# Patient Record
Sex: Female | Born: 1975 | Race: Black or African American | Hispanic: No | Marital: Single
Health system: Midwestern US, Community
[De-identification: ages and names within clinical notes are randomized; demographics above are authoritative.]

## PROBLEM LIST (undated history)

## (undated) DIAGNOSIS — E1165 Type 2 diabetes mellitus with hyperglycemia: Secondary | ICD-10-CM

## (undated) DIAGNOSIS — Z794 Long term (current) use of insulin: Secondary | ICD-10-CM

## (undated) DIAGNOSIS — F411 Generalized anxiety disorder: Secondary | ICD-10-CM

## (undated) DIAGNOSIS — E042 Nontoxic multinodular goiter: Secondary | ICD-10-CM

## (undated) DIAGNOSIS — I10 Essential (primary) hypertension: Principal | ICD-10-CM

## (undated) DIAGNOSIS — E559 Vitamin D deficiency, unspecified: Secondary | ICD-10-CM

## (undated) DIAGNOSIS — E119 Type 2 diabetes mellitus without complications: Principal | ICD-10-CM

## (undated) DIAGNOSIS — Z1231 Encounter for screening mammogram for malignant neoplasm of breast: Secondary | ICD-10-CM

## (undated) DIAGNOSIS — R51 Headache: Secondary | ICD-10-CM

## (undated) DIAGNOSIS — R519 Headache, unspecified: Secondary | ICD-10-CM

## (undated) DIAGNOSIS — E782 Mixed hyperlipidemia: Secondary | ICD-10-CM

## (undated) DIAGNOSIS — G4750 Parasomnia, unspecified: Secondary | ICD-10-CM

## (undated) HISTORY — PX: FOOT SURGERY: SHX648

## (undated) HISTORY — PX: TUBAL LIGATION: SHX77

## (undated) HISTORY — PX: DENTAL SURGERY: SHX609

---

## 2012-10-05 MED ORDER — RIZATRIPTAN 10 MG TAB, RAPID DISSOLVE
10 mg | ORAL_TABLET | ORAL | Status: DC
Start: 2012-10-05 — End: 2019-10-17

## 2012-10-05 NOTE — Progress Notes (Signed)
[facility] NEUROLOGY [place], [facility] [place] [facility]  [address]  [place], [place] [address]  [phone] Fax                  Referring: No primary provider on file.    Chief Complaint   Patient presents with   ??? Headache   Here for evaluation of headaches.  She has had headaches all her life.  She says she will get a HA every day and she will awaken with HA qam and takes excedrin.  She takes this qday.  She snores.  She will awaken snoring.  She will hear herself snoring in her sleep.  She says starting in March she started to have a "constant migraine" and has had to get toradol shots.  It came back a month ago and had another Toradol shot.  She will have a more intense HA around the time of her period.  She notes this will last for days.  She is irritable.  She denies nausea or vomit.  She just wants to go home.  Photophobia and phonophobia present.  Duration typically 4-6 days no matter what she takes although she will get some relief from excedrin.  She has been given fioricet and no help.  Imitrex tabs tried, unknown dose and says it was ineffective.  Also tried the imitrex nasal spray.  No other triptans.  She has been started on topamax for 1 month and no difference.  No polysomnogram.  Not gone 6-8 weeks without excedrin.  No focal defits, visual complaint.    Past Medical History   Diagnosis Date   ??? Headache    ??? Diabetes    ??? Hypertension    ??? Depression      Past Surgical History   Procedure Laterality Date   ??? Hx gyn         Current Outpatient Prescriptions   Medication Sig Dispense Refill   ??? topiramate (TOPAMAX) 50 mg tablet Take  by mouth daily.       ??? ketorolac (TORADOL) 10 mg tablet Take  by mouth. q6h as need up to 5 days.       ??? amLODIPine (NORVASC) 10 mg tablet Take  by mouth daily.       ??? metFORMIN (GLUCOPHAGE) 500 mg tablet Take  by mouth two (2) times daily (with meals).       ??? Liraglutide (VICTOZA) 0.6 mg/0.1 mL (18 mg/3 mL) sub-q pen 0.6 mg by  SubCUTAneous route daily.         No current facility-administered medications for this visit.    BG controlled    Allergies   Allergen Reactions   ??? Codeine Other (comments)     dizziness   ??? Percocet (Oxycodone-Acetaminophen) Other (comments)     dizziness       History   Substance Use Topics   ??? Smoking status: Never Smoker    ??? Smokeless tobacco: Never Used   ??? Alcohol Use: No   Works in [employer]    History reviewed. No pertinent family history.    Review of Systems  As above. See intake form in CC media section  Remainder of comprehensive review is negative    Examination  BP 138/96   Pulse 98   Temp(Src) 98.3 ??F (36.8 ??C)   Ht 5\' 8"  (1.727 m)   Wt 130.636 kg (288 lb)   BMI 43.8 kg/m2   SpO2 99%  Pleasant, well appearing.  No icterus.  Oropharynx clear.  Supple neck without bruit.  Heart regular.  No murmur.  No edema.      Neurologically, she is awake, alert, and oriented with normal speech and language.  Her cognition is normal.    Intact cranial nerves 2-12.  No nystagmus.  Visual fields full to confrontation.  Disk margins are flat bilaterally.  She has normal bulk and tone.  She has no abnormal movement.  She has no pronation or drift.  She generates full strength in the upper and lower extremities to direct confrontational testing.  Reflexes are symmetrical in the upper and lower extremities bilaterally.  Her toes are down bilaterally.  No Hoffman.  Finger nose finger and rapid alternating movements are normal.  Steady gait.   No sensory deficit to primary modalities.      Impression/Plan  Migraines.  We discussed migraine pathophysiology and the medications we use to treat them including triptans for abortive therapy and medications use to prevent.  We discussed migraines are to be viewed as a chronic condition which can be managed much like diabetes or HTN.    We discussed analgesic induced HA and the use of analgesics more than 2-3 times weekly will do nothing but drive the HA and to break this cycle  all analgesics, whether OTC or prescribed have to be discontinued and HA likely to get worse before they get better.    Written instructions provided today.    Given weight, sxs suggestive of OSA and HA on awakening will get polysomnogram    Follow after above

## 2012-10-05 NOTE — Patient Instructions (Addendum)
Stop all excedrin and headaches will get worse before they get better    Use Maxalt to abort a severe HA around your period headache.  1 at HA onset and repeat in 2 hours if needed.  Max 2 in 24 hours    Have sleep study done

## 2012-10-05 NOTE — Progress Notes (Signed)
New patient presenting with headaches. History of migraines for 12 years. Has tried topamax in the past. Reports daily headaches and migraines monthly during cycles.

## 2012-10-09 NOTE — Telephone Encounter (Signed)
Called and LM on VM for patient that we did submit the PA last Friday 10/06/12 for the Maxalt. Informed her that there was a 48-72 business hour turnaround time. Notified her that once a decision was received that we would notify her and her pharmacy. Left our office phone number if she had any further questions.

## 2012-10-09 NOTE — Telephone Encounter (Signed)
Pt calling to check status of the PA for the maxalt.  She said you may call and if you get vm just leave a msg of what the status is.

## 2012-10-12 NOTE — Telephone Encounter (Signed)
Patient called checking to see the status of her PA for her Maxalt.

## 2012-10-16 NOTE — Telephone Encounter (Addendum)
Called and spoke with [facility]. Spoke with Corsica P. The generic Maxalt was approved, but the quantity still needed to be. Quantity was approved from 10/16/12 to 10/16/13. Medication approved from 10/06/12 to 10/07/13. Ref # [id number].     Called pharmacy and notified them of the approval. Called [facility] pharmacy [phone]    Called and notified the patient of this. She stated that she lived far from our office and wanted the medication called in somewhere close to her. Informed her I could give her the pharmacy number and have the RX transferred as the PA was attached to this transaction. She asked for me to call her back and leave info on her VM since she did not have a pen. Called back and left pharmacy phone number. Told her she should just be able to call her pharmacy and give them that number and they could take care of that from there.

## 2012-10-31 NOTE — Telephone Encounter (Signed)
Spoke to patient who stated Maxalt is not working. Continue to have migraines at cycle time. Informed patient to continue with plan at this time. Per Leonette Monarch patient she refer to headache manual patient was given at visit. Patient has manual. Per Diannia Ruder next month patient can use ibuprofen only at time of menstrual cycle and patient should start magnesium oxide per manual. Patient understood instructions.

## 2012-10-31 NOTE — Telephone Encounter (Addendum)
Via hello message : please call me reguarding the appt i had about a month ago. The medicine you gave me is not working.

## 2012-11-10 NOTE — Telephone Encounter (Signed)
Spoke to patient and informed her I have refaxed information to [facility] today. If she does not hear anything by Monday, call office back. Patient acknowledged understanding and appointment for 11/16/12 was cancelled and patient to reschedule after sleep study.

## 2012-11-10 NOTE — Telephone Encounter (Signed)
Patient says she has not been able to get in contact with [facility] for her sleep study. Please call.

## 2012-11-13 ENCOUNTER — Telehealth

## 2012-11-13 NOTE — Telephone Encounter (Signed)
STUDY ORDER CONFIRMATION    Study Indication:   327.20  Study: Diagnostic polysomnogram - CPT 95810: Split Study if patient meets the criteria.  Study Instructions / additional orders:    Sten Dematteo, M.D.  Diplomate in Sleep Medicine, [facility]

## 2012-11-13 NOTE — Telephone Encounter (Signed)
STUDY ORDER CONFIRMATION  Sandy King 11-06-1976      Type of Study Requested: Diagnostic polysomnogram - CPT 95810: Split Study if patient meets the criteria.  Referring Physician: Stanford Scotland      Date of Study: 11/15/12   Spoke with: Reuel Derby               Snoring      yes    Excessive Daytime Sleepiness   no    Witnessed Apnea     yes    Insomnia      no    Hypertension      yes    Cardiovascular Disease    no    Seizures      no    Height: 5'8"   Weight: 285   (over 499 lb can only be done at [facility])     Have you been in hospital?    no   Has it been at least 72 hrs. since discharge? no   (If not at least 72 hrs, cannot see pt. for study)    Are you currently on an antibiotic?   no  If yes, for what reason? n/a     Do you need anything from [place] for your employer?  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    no    Have you had a sleep study before?   no  If yes, when and where? n/a  Are you currently using CPAP?   no    Do you have any special needs?  Wheelchair      no  Oxygen      no    If on O2: n/al/m  For n/a Hrs  Help to and from the bathroom   no    (If yes to any special needs a care giver may need to come with the patient and stay the night.)    Notes: No sleep tak/walking  No grinding of teeth            Compiled by:  Jeral Pinch     Date:  11/13/12

## 2016-11-24 ENCOUNTER — Emergency Department (HOSPITAL_BASED_OUTPATIENT_CLINIC_OR_DEPARTMENT_OTHER)
Admission: EM | Admit: 2016-11-24 | Discharge: 2016-11-24 | Disposition: A | Discharge status: Home / Self Care | Payer: Medicaid Other | Attending: Physician Assistant | Admitting: Physician Assistant

## 2016-11-24 ENCOUNTER — Encounter (HOSPITAL_BASED_OUTPATIENT_CLINIC_OR_DEPARTMENT_OTHER): Payer: Self-pay | Admitting: *Deleted

## 2016-11-24 DIAGNOSIS — I10 Essential (primary) hypertension: Secondary | ICD-10-CM | POA: Insufficient documentation

## 2016-11-24 DIAGNOSIS — E119 Type 2 diabetes mellitus without complications: Secondary | ICD-10-CM | POA: Diagnosis not present

## 2016-11-24 DIAGNOSIS — G43109 Migraine with aura, not intractable, without status migrainosus: Secondary | ICD-10-CM | POA: Diagnosis not present

## 2016-11-24 DIAGNOSIS — Z79899 Other long term (current) drug therapy: Secondary | ICD-10-CM | POA: Diagnosis not present

## 2016-11-24 DIAGNOSIS — G43909 Migraine, unspecified, not intractable, without status migrainosus: Secondary | ICD-10-CM | POA: Diagnosis present

## 2016-11-24 HISTORY — DX: Type 2 diabetes mellitus without complications: E11.9

## 2016-11-24 HISTORY — DX: Essential (primary) hypertension: I10

## 2016-11-24 MED ORDER — KETOROLAC TROMETHAMINE 15 MG/ML IJ SOLN
INTRAMUSCULAR | Status: AC
  Filled 2016-11-24: qty 1

## 2016-11-24 MED ORDER — KETOROLAC TROMETHAMINE 30 MG/ML IJ SOLN
15.0000 mg | Freq: Once | INTRAMUSCULAR | Status: AC
  Administered 2016-11-24: 15 mg via INTRAMUSCULAR

## 2016-11-24 NOTE — ED Notes (Signed)
ED Provider at bedside. 

## 2016-11-24 NOTE — ED Triage Notes (Signed)
Mha since yesterday. Denies n/v. +light sensitivity. Reports has missed work the last 2 days due to Sx. Hx of same

## 2016-11-24 NOTE — ED Provider Notes (Signed)
[facility] Provider Note   CSN: [id number] Arrival date & time: 11/24/16  1802  By signing my name below, I, Clarisse GougeXavier Herndon, attest that this documentation has been prepared under the direction and in the presence of Gokul Waybright Randall AnLyn Keayra Graham, MD. Electronically signed, Clarisse GougeXavier Herndon, ED Scribe. 11/24/16. 6:18 PM.    History   Chief Complaint Chief Complaint  Patient presents with  . Migraine   The history is provided by the patient and medical records. No language interpreter was used.    HPI Comments: Cynthia Byrd is a 40 y.o. female who presents to the Emergency Department complaining of migraine x 3 days. States it wont go away, though normally do. She states she can not work secondary to pain. She has rested at home with no relief. She states toradol normally relieves her symptoms.   Past Medical History:  Diagnosis Date  . Diabetes mellitus without complication (HCC)   . Hypertension     There are no active problems to display for this patient.   Past Surgical History:  Procedure Laterality Date  . DENTAL SURGERY    . FOOT SURGERY      OB History    No data available       Home Medications    Prior to Admission medications   Medication Sig Start Date End Date Taking? Authorizing Provider  amLODipine (NORVASC) 5 MG tablet Take 5 mg by mouth daily.   Yes Historical Provider, MD  glipiZIDE (GLUCOTROL) 10 MG tablet Take 10 mg by mouth 2 (two) times daily before a meal.   Yes Historical Provider, MD  liraglutide (VICTOZA) 18 MG/3ML SOPN Inject into the skin.   Yes Historical Provider, MD    Family History No family history on file.  Social History Social History  Substance Use Topics  . Smoking status: Never Smoker  . Smokeless tobacco: Never Used  . Alcohol use Yes     Comment: wine     Allergies   Codeine and Percocet [oxycodone-acetaminophen]   Review of Systems Review of Systems  All other systems reviewed and are negative.  A  complete 10 system review of systems was obtained and all systems are negative except as noted in the HPI and PMH.    Physical Exam Updated Vital Signs BP 140/86 (BP Location: Left Arm)   Pulse 98   Temp 98.1 F (36.7 C) (Oral)   Resp 18   Ht 5\' 8"  (1.727 m)   Wt 289 lb (131.1 kg)   LMP 11/03/2016   SpO2 100%   BMI 43.94 kg/m   Physical Exam  Constitutional: She is oriented to person, place, and time. She appears well-developed and well-nourished.  HENT:  Head: Normocephalic and atraumatic.  Eyes: Pupils are equal, round, and reactive to light.  Cardiovascular: Regular rhythm.   Pulmonary/Chest: Effort normal.  Abdominal: Soft.  Musculoskeletal: Normal range of motion.  Neurological: She is alert and oriented to person, place, and time. No cranial nerve deficit.  5/5 strength in major muscle groups of  bilateral upper and lower extremities. Speech normal. No facial asymetry.   Nursing note and vitals reviewed.    ED Treatments / Results  DIAGNOSTIC STUDIES: Oxygen Saturation is 100% on RA, normal by my interpretation.    COORDINATION OF CARE: 6:19 PM Discussed treatment plan with pt at bedside and pt agreed to plan.  Labs (all labs ordered are listed, but only abnormal results are displayed) Labs Reviewed - No data to display  EKG  EKG Interpretation None       Radiology No results found.  Procedures Procedures (including critical care time)  Medications Ordered in ED Medications - No data to display   Initial Impression / Assessment and Plan / ED Course  I have reviewed the triage vital signs and the nursing notes.  Pertinent labs & imaging results that were available during my care of the patient were reviewed by me and considered in my medical decision making (see chart for details).  Clinical Course     Patient is a well-appearing 40 year old female presenting with migraine. Patient reports she's missed the last 2 days of work because of migraine.  She needs note. Patient reports that usually she gets a shot of ketorolac and then feels much better. We'll give her shot and then have her go home and rest. We'll give her note. Patient has normal cranial nerve exam and otherwise appears very well.  Final Clinical Impressions(s) / ED Diagnoses   Final diagnoses:  None    New Prescriptions New Prescriptions   No medications on file  I personally performed the services described in this documentation, which was scribed in my presence. The recorded information has been reviewed and is accurate.      Charlea Nardo Randall AnLyn Lavonna Lampron, MD 11/24/16 1836

## 2016-12-07 ENCOUNTER — Emergency Department (HOSPITAL_BASED_OUTPATIENT_CLINIC_OR_DEPARTMENT_OTHER): Payer: Medicaid Other

## 2016-12-07 ENCOUNTER — Encounter (HOSPITAL_BASED_OUTPATIENT_CLINIC_OR_DEPARTMENT_OTHER): Payer: Self-pay | Admitting: *Deleted

## 2016-12-07 ENCOUNTER — Emergency Department (HOSPITAL_BASED_OUTPATIENT_CLINIC_OR_DEPARTMENT_OTHER)
Admission: EM | Admit: 2016-12-07 | Discharge: 2016-12-08 | Disposition: A | Discharge status: Home / Self Care | Payer: Medicaid Other | Attending: Physician Assistant | Admitting: Physician Assistant

## 2016-12-07 DIAGNOSIS — M791 Myalgia: Secondary | ICD-10-CM | POA: Diagnosis not present

## 2016-12-07 DIAGNOSIS — E119 Type 2 diabetes mellitus without complications: Secondary | ICD-10-CM | POA: Diagnosis not present

## 2016-12-07 DIAGNOSIS — Z7984 Long term (current) use of oral hypoglycemic drugs: Secondary | ICD-10-CM | POA: Insufficient documentation

## 2016-12-07 DIAGNOSIS — Y999 Unspecified external cause status: Secondary | ICD-10-CM | POA: Insufficient documentation

## 2016-12-07 DIAGNOSIS — Y929 Unspecified place or not applicable: Secondary | ICD-10-CM | POA: Diagnosis not present

## 2016-12-07 DIAGNOSIS — W25XXXA Contact with sharp glass, initial encounter: Secondary | ICD-10-CM | POA: Diagnosis not present

## 2016-12-07 DIAGNOSIS — Y939 Activity, unspecified: Secondary | ICD-10-CM | POA: Insufficient documentation

## 2016-12-07 DIAGNOSIS — S90852A Superficial foreign body, left foot, initial encounter: Secondary | ICD-10-CM | POA: Insufficient documentation

## 2016-12-07 DIAGNOSIS — I10 Essential (primary) hypertension: Secondary | ICD-10-CM | POA: Diagnosis not present

## 2016-12-07 MED ORDER — LIDOCAINE HCL 1 % IJ SOLN
INTRAMUSCULAR | Status: AC
  Filled 2016-12-07: qty 20

## 2016-12-07 MED ORDER — LIDOCAINE HCL (PF) 1 % IJ SOLN: 30.0000 mL | Freq: Once | INTRAMUSCULAR | Status: DC

## 2016-12-07 MED ORDER — TETANUS-DIPHTH-ACELL PERTUSSIS 5-2.5-18.5 LF-MCG/0.5 IM SUSP
0.5000 mL | Freq: Once | INTRAMUSCULAR | Status: AC
  Administered 2016-12-07: 0.5 mL via INTRAMUSCULAR
  Filled 2016-12-07: qty 0.5

## 2016-12-07 NOTE — ED Provider Notes (Addendum)
[facility] Note   CSN: [id number] Arrival date & time: 12/07/16  1730   By signing my name below, I, Avnee Patel, attest that this documentation has been prepared under the direction and in the presence of Valarie Farace Randall An, MD  Electronically Signed: Clovis Pu, ED Scribe. 12/07/16. 9:26 PM.   History   Chief Complaint Chief Complaint  Patient presents with  . Foreign Body in Skin   The history is provided by the patient. No language interpreter was used.   HPI Comments:  Cynthia Byrd is a 41 y.o. female, with a hx of foot surgery, who presents to the Emergency Department complaining of sudden onset, persistent left foot pain due to a foreign body in her left foot (heel) x yesterday. Pt states she stepped on a piece of glass. Her pain is worse to the touch. She has tried to take the foreign body out with tweezers with no relief. Pt denies any other associated symptoms and any other modifying factors at this time.   Past Medical History:  Diagnosis Date  . Diabetes mellitus without complication (HCC)   . Hypertension     There are no active problems to display for this patient.   Past Surgical History:  Procedure Laterality Date  . DENTAL SURGERY    . FOOT SURGERY      OB History    No data available       Home Medications    Prior to Admission medications   Medication Sig Start Date End Date Taking? Authorizing Provider  amLODipine (NORVASC) 5 MG tablet Take 5 mg by mouth daily.    Historical Provider, MD  glipiZIDE (GLUCOTROL) 10 MG tablet Take 10 mg by mouth 2 (two) times daily before a meal.    Historical Provider, MD  liraglutide (VICTOZA) 18 MG/3ML SOPN Inject into the skin.    Historical Provider, MD    Family History No family history on file.  Social History Social History  Substance Use Topics  . Smoking status: Never Smoker  . Smokeless tobacco: Never Used  . Alcohol use Yes     Comment: wine     Allergies     Codeine and Percocet [oxycodone-acetaminophen]   Review of Systems Review of Systems  Musculoskeletal: Positive for myalgias.  Neurological: Negative for numbness.  All other systems reviewed and are negative.  Physical Exam Updated Vital Signs BP 140/88   Pulse 94   Temp 98.2 F (36.8 C) (Oral)   Resp 20   Ht 5\' 8"  (1.727 m)   Wt 289 lb (131.1 kg)   LMP 12/03/2016   SpO2 99%   BMI 43.94 kg/m   Physical Exam  Constitutional: She is oriented to person, place, and time. She appears well-developed and well-nourished. No distress.  HENT:  Head: Normocephalic and atraumatic.  Eyes: Conjunctivae are normal.  Cardiovascular: Normal rate.   Pulmonary/Chest: Effort normal.  Abdominal: She exhibits no distension.  Musculoskeletal:  Pain with palpation to pinpoint region.   Neurological: She is alert and oriented to person, place, and time.  Skin: Skin is warm and dry.  Well healed wound.   Psychiatric: She has a normal mood and affect.  Nursing note and vitals reviewed.  ED Treatments / Results  DIAGNOSTIC STUDIES:  Oxygen Saturation is 98% on RA, normal by my interpretation.    COORDINATION OF CARE:  9:22 PM Discussed treatment plan with pt at bedside and pt agreed to plan.  Labs (all labs ordered are  listed, but only abnormal results are displayed) Labs Reviewed - No data to display  EKG  EKG Interpretation None       Radiology Dg Foot Complete Left  Result Date: 12/07/2016 CLINICAL DATA:  Stepped on a piece of glass in region of heel. EXAM: LEFT FOOT - COMPLETE 3+ VIEW COMPARISON:  None. FINDINGS: Tiny plantar cutaneous radiopaque foreign body is seen along the plantar aspect of the hindfoot measuring approximately 3 x 2 mm. No acute osseous abnormality. Plantar and dorsal calcaneal enthesophytes are noted. Minor degenerative joint space narrowing and spurring of the fifth PIP. IMPRESSION: Tiny radiopaque foreign body in the cutaneous soft tissues of the heel  measuring approximately 3 x 2 mm. Calcaneal enthesophytes. No acute osseous abnormality. Electronically Signed   By: Tollie Ethavid  Kwon M.D.   On: 12/07/2016 18:17    Procedures .Foreign Body Removal Date/Time: 12/07/2016 9:39 PM Performed by: Bary CastillaMACKUEN, Malai Lady LYN Authorized by: Bary CastillaMACKUEN, Diamante Rubin LYN  Consent: Verbal consent obtained. Risks and benefits: risks, benefits and alternatives were discussed Consent given by: patient Patient understanding: patient states understanding of the procedure being performed Patient consent: the patient's understanding of the procedure matches consent given Procedure consent: procedure consent matches procedure scheduled Imaging studies: imaging studies available Patient identity confirmed: verbally with patient Intake: left foot (heel) Anesthesia: local infiltration  Anesthesia: Local Anesthetic: lidocaine 1% without epinephrine Anesthetic total: 30 mL  Sedation: Patient sedated: no Patient restrained: no Complexity: simple 1 objects recovered. Objects recovered: piece of glass Post-procedure assessment: foreign body removed Patient tolerance: Patient tolerated the procedure well with no immediate complications   (including critical care time)  Medications Ordered in ED Medications - No data to display   Initial Impression / Assessment and Plan / ED Course  I have reviewed the triage vital signs and the nursing notes.  Pertinent labs & imaging results that were available during my care of the patient were reviewed by me and considered in my medical decision making (see chart for details).  Clinical Course    I personally performed the services described in this documentation, which was scribed in my presence. The recorded information has been reviewed and is accurate.    Glass seen on xray. Removed- techinically difficult. Repeat xray shows resolution.   Removed by making small incision with 11 blad and using hemostats to remove glass.      Final Clinical Impressions(s) / ED Diagnoses   Final diagnoses:  None    New Prescriptions New Prescriptions   No medications on file      Yazmine Sorey Randall AnLyn Jeffrey Graefe, MD 12/13/16 0912    Zubair Lofton Randall AnLyn Ren Aspinall, MD 01/02/17 [id number]

## 2017-01-15 ENCOUNTER — Encounter: Payer: Self-pay | Admitting: Emergency Medicine

## 2017-01-15 ENCOUNTER — Emergency Department (INDEPENDENT_AMBULATORY_CARE_PROVIDER_SITE_OTHER)
Admission: EM | Admit: 2017-01-15 | Discharge: 2017-01-15 | Disposition: A | Discharge status: Home / Self Care | Payer: Medicaid Other | Source: Home / Self Care | Attending: Family Medicine | Admitting: Family Medicine

## 2017-01-15 DIAGNOSIS — R69 Illness, unspecified: Secondary | ICD-10-CM

## 2017-01-15 DIAGNOSIS — J111 Influenza due to unidentified influenza virus with other respiratory manifestations: Secondary | ICD-10-CM

## 2017-01-15 MED ORDER — OSELTAMIVIR PHOSPHATE 75 MG PO CAPS: 75.0000 mg | ORAL_CAPSULE | Freq: Two times a day (BID) | ORAL | 0 refills | Status: DC

## 2017-01-15 NOTE — ED Triage Notes (Signed)
Pt c/o fever, chills and cough that started yesterday. States she had tylenol and motrin at 4 and 5pm.

## 2017-01-15 NOTE — Discharge Instructions (Signed)
°  You may take 400-600mg Ibuprofen (Motrin) every 6-8 hours for fever and pain  °Alternate with Tylenol  °You may take 500mg Tylenol every 4-6 hours as needed for fever and pain  °Follow-up with your primary care provider next week for recheck of symptoms if not improving.  °Be sure to drink plenty of fluids and rest, at least 8hrs of sleep a night, preferably more while you are sick. °Return urgent care or go to closest ER if you cannot keep down fluids/signs of dehydration, fever not reducing with Tylenol, difficulty breathing/wheezing, stiff neck, worsening condition, or other concerns (see below)  ° °Oseltamivir (Tamiflu) is an antiviral medication that can help decrease symptoms of the flu by about 1 days and lessen severity of symptoms.  It is best to start this medication within first 48 hours of symptoms, however, some studies have shown relief even after the 48 hour time frame.  This medication may cause stomach upset including nausea, vomiting and diarrhea.  It may also cause dizziness or hallucinations in children.  To help prevent stomach upset, you may take this medication with food.  If you are still having unwanted symptoms, you may stop taking this medication as it is not as important to finish the entire course like antibiotics.  If you have questions/concerns please call our office or follow up with your primary care provider.   ° °

## 2017-01-15 NOTE — ED Provider Notes (Signed)
CSN: [id number]     Arrival date & time 01/15/17  1837 History   First MD Initiated Contact with Patient 01/15/17 1846     Chief Complaint  Patient presents with  . Fever   (Consider location/radiation/quality/duration/timing/severity/associated sxs/prior Treatment) HPI  Jermiya Leffel is a 41 y.o. female presenting to UC with c/o sudden onset body aches, mildly productive cough, fever of 103.1*F, chills, and fatigue since yesterday.  Pt notes her son, who is with her, was taken to the hospital the other night due to passing out twice and running a fever.  Pt had a CT scan and normal bloodwork but pt notes they did not test for the flu because his fever "wasn't high enough."   The son still has a cough.  Pt did not get the flu vaccine this season "I don't believe in it."  She did take acetaminophen and ibuprofen at 5:30PM PTA.  Denies hx of asthma but has had bronchitis in the past.    Past Medical History:  Diagnosis Date  . Diabetes mellitus without complication (HCC)   . Hypertension    Past Surgical History:  Procedure Laterality Date  . DENTAL SURGERY    . FOOT SURGERY     History reviewed. No pertinent family history. Social History  Substance Use Topics  . Smoking status: Never Smoker  . Smokeless tobacco: Never Used  . Alcohol use Yes     Comment: wine   OB History    No data available     Review of Systems  Constitutional: Positive for chills, fatigue and fever.  HENT: Positive for congestion and rhinorrhea. Negative for ear pain, sore throat, trouble swallowing and voice change.   Respiratory: Positive for cough. Negative for shortness of breath.   Cardiovascular: Negative for chest pain and palpitations.  Gastrointestinal: Negative for abdominal pain, diarrhea, nausea and vomiting.  Musculoskeletal: Positive for arthralgias and myalgias. Negative for back pain.  Skin: Negative for rash.  Neurological: Positive for headaches. Negative for dizziness and  light-headedness.    Allergies  Codeine and Percocet [oxycodone-acetaminophen]  Home Medications   Prior to Admission medications   Medication Sig Start Date End Date Taking? Authorizing Provider  amLODipine (NORVASC) 5 MG tablet Take 5 mg by mouth daily.    Historical Provider, MD  glipiZIDE (GLUCOTROL) 10 MG tablet Take 10 mg by mouth 2 (two) times daily before a meal.    Historical Provider, MD  liraglutide (VICTOZA) 18 MG/3ML SOPN Inject into the skin.    Historical Provider, MD  oseltamivir (TAMIFLU) 75 MG capsule Take 1 capsule (75 mg total) by mouth every 12 (twelve) hours. 01/15/17   Junius FinnerErin O'Malley, PA-C   Meds Ordered and Administered this Visit  Medications - No data to display  BP 144/90 (BP Location: Right Arm)   Pulse 120   Temp 99.8 F (37.7 C) (Oral)   Wt 287 lb (130.2 kg)   SpO2 100%   BMI 43.64 kg/m  No data found.   Physical Exam  Constitutional: She is oriented to person, place, and time. She appears well-developed and well-nourished. No distress.  Pt appears mildly fatigued and acutely ill but non-toxic appearing. Alert and cooperative during exam.  HENT:  Head: Normocephalic and atraumatic.  Right Ear: Tympanic membrane normal.  Left Ear: Tympanic membrane normal.  Nose: Mucosal edema present.  Mouth/Throat: Uvula is midline, oropharynx is clear and moist and mucous membranes are normal.  Eyes: EOM are normal.  Neck: Normal range of motion. Neck  supple.  Cardiovascular: Regular rhythm.  Tachycardia present.   Pulmonary/Chest: Effort normal and breath sounds normal. No stridor. No respiratory distress. She has no wheezes. She has no rales.  Musculoskeletal: Normal range of motion.  Lymphadenopathy:    She has no cervical adenopathy.  Neurological: She is alert and oriented to person, place, and time.  Skin: Skin is warm. She is diaphoretic.  Psychiatric: She has a normal mood and affect. Her behavior is normal.  Nursing note and vitals  reviewed.   Urgent Care Course     Procedures (including critical care time)  Labs Review Labs Reviewed - No data to display  Imaging Review No results found.    MDM   1. Influenza-like illness    Hx and exam c/w influenza w/o evidence of underlying bacterial infection at this time.   Tachycardia likely due to fever, pt did take acetaminophen and ibuprofen just PTA. Temp appears to be improving from pt's reported 103*F down to 99.8*F in UC.  Encouraged good hydration and rest.  Discussed risks/benefits of Tamiflu. Pt would like to try the treatment. Rx: Tamiflu F/u with PCP next week if not improving, sooner if worsening.   Junius Finner, PA-C 01/16/17 1115

## 2017-02-15 ENCOUNTER — Encounter (HOSPITAL_BASED_OUTPATIENT_CLINIC_OR_DEPARTMENT_OTHER): Payer: Self-pay

## 2017-02-15 DIAGNOSIS — I10 Essential (primary) hypertension: Secondary | ICD-10-CM | POA: Insufficient documentation

## 2017-02-15 DIAGNOSIS — Z7984 Long term (current) use of oral hypoglycemic drugs: Secondary | ICD-10-CM | POA: Insufficient documentation

## 2017-02-15 DIAGNOSIS — E119 Type 2 diabetes mellitus without complications: Secondary | ICD-10-CM | POA: Insufficient documentation

## 2017-02-15 DIAGNOSIS — G43009 Migraine without aura, not intractable, without status migrainosus: Secondary | ICD-10-CM | POA: Insufficient documentation

## 2017-02-15 DIAGNOSIS — G43909 Migraine, unspecified, not intractable, without status migrainosus: Secondary | ICD-10-CM | POA: Diagnosis present

## 2017-02-15 NOTE — ED Triage Notes (Addendum)
Pt c/o migraine type headache with nausea and photophobia, has tried multiple medications without relief at home.  Pt states that she just wants a shot of toradol like last time and she should feel better.

## 2017-02-16 ENCOUNTER — Emergency Department (HOSPITAL_BASED_OUTPATIENT_CLINIC_OR_DEPARTMENT_OTHER)
Admission: EM | Admit: 2017-02-16 | Discharge: 2017-02-16 | Disposition: A | Discharge status: Home / Self Care | Payer: Medicaid Other | Attending: Emergency Medicine | Admitting: Emergency Medicine

## 2017-02-16 ENCOUNTER — Encounter (HOSPITAL_BASED_OUTPATIENT_CLINIC_OR_DEPARTMENT_OTHER): Payer: Self-pay | Admitting: Emergency Medicine

## 2017-02-16 DIAGNOSIS — G43009 Migraine without aura, not intractable, without status migrainosus: Secondary | ICD-10-CM | POA: Diagnosis not present

## 2017-02-16 HISTORY — DX: Headache: R51

## 2017-02-16 HISTORY — DX: Headache, unspecified: R51.9

## 2017-02-16 LAB — PREGNANCY, URINE: Preg Test, Ur: NEGATIVE

## 2017-02-16 MED ORDER — METOCLOPRAMIDE HCL 5 MG/ML IJ SOLN
10.0000 mg | Freq: Once | INTRAMUSCULAR | Status: AC
  Administered 2017-02-16: 10 mg via INTRAMUSCULAR
  Filled 2017-02-16: qty 2

## 2017-02-16 MED ORDER — KETOROLAC TROMETHAMINE 60 MG/2ML IM SOLN
60.0000 mg | Freq: Once | INTRAMUSCULAR | Status: AC
Start: 2017-02-16 — End: 2017-02-16
  Administered 2017-02-16: 60 mg via INTRAMUSCULAR
  Filled 2017-02-16: qty 2

## 2017-02-16 NOTE — ED Notes (Addendum)
Had to wake the patient up to reassess

## 2017-02-16 NOTE — ED Provider Notes (Signed)
[facility]   CSN: [id number] Arrival date & time: 02/15/17  2258     History   Chief Complaint Chief Complaint  Patient presents with  . Headache    HPI Cynthia Byrd is a 41 y.o. female.  The history is provided by the patient. No language interpreter was used.  Headache   This is a recurrent problem. The current episode started 12 to 24 hours ago. The problem occurs constantly. The problem has not changed since onset.The headache is associated with nothing. The pain is located in the frontal region. The pain is severe. The pain does not radiate. Pertinent negatives include no anorexia, no fever, no malaise/fatigue, no chest pressure, no near-syncope, no orthopnea, no palpitations, no syncope, no shortness of breath, no nausea and no vomiting. Treatments tried: fioricet and topamax. The treatment provided no relief.  Not sudden onset not the worst HA of her life this is typical of her migraines last seen a few months ago for same.  Also wants a refill of her tramadol.    Past Medical History:  Diagnosis Date  . Diabetes mellitus without complication (HCC)   . Headache   . Hypertension     There are no active problems to display for this patient.   Past Surgical History:  Procedure Laterality Date  . DENTAL SURGERY    . FOOT SURGERY      OB History    No data available       Home Medications    Prior to Admission medications   Medication Sig Start Date End Date Taking? Authorizing Provider  amLODipine (NORVASC) 5 MG tablet Take 5 mg by mouth daily.    Historical Provider, MD  glipiZIDE (GLUCOTROL) 10 MG tablet Take 10 mg by mouth 2 (two) times daily before a meal.    Historical Provider, MD  liraglutide (VICTOZA) 18 MG/3ML SOPN Inject into the skin.    Historical Provider, MD  oseltamivir (TAMIFLU) 75 MG capsule Take 1 capsule (75 mg total) by mouth every 12 (twelve) hours. 01/15/17   Junius Finner, PA-C    Family History No family  history on file.  Social History Social History  Substance Use Topics  . Smoking status: Never Smoker  . Smokeless tobacco: Never Used  . Alcohol use Yes     Comment: wine     Allergies   Codeine and Percocet [oxycodone-acetaminophen]   Review of Systems Review of Systems  Constitutional: Negative for diaphoresis, fever and malaise/fatigue.  Respiratory: Negative for shortness of breath.   Cardiovascular: Negative for palpitations, orthopnea, syncope and near-syncope.  Gastrointestinal: Negative for anorexia, nausea and vomiting.  Musculoskeletal: Negative for neck pain and neck stiffness.  Neurological: Positive for headaches. Negative for dizziness, tremors, seizures, syncope, facial asymmetry, speech difficulty, weakness, light-headedness and numbness.  All other systems reviewed and are negative.    Physical Exam Updated Vital Signs BP 141/97 (BP Location: Right Arm)   Pulse 97   Temp 98.7 F (37.1 C) (Oral)   Resp 24   Ht 5\' 8"  (1.727 m)   Wt 280 lb (127 kg)   LMP 01/30/2017   SpO2 100%   BMI 42.57 kg/m   Physical Exam  Constitutional: She is oriented to person, place, and time. She appears well-developed and well-nourished. No distress.  HENT:  Head: Normocephalic and atraumatic.  Right Ear: External ear normal.  Left Ear: External ear normal.  Mouth/Throat: Oropharynx is clear and moist. No oropharyngeal exudate.  Eyes: Conjunctivae  and EOM are normal. Pupils are equal, round, and reactive to light.  No proptosis, intact cognition  Neck: Normal range of motion. Neck supple. No JVD present.  Cardiovascular: Normal rate, regular rhythm, normal heart sounds and intact distal pulses.   Pulmonary/Chest: Effort normal and breath sounds normal. No stridor. No respiratory distress. She has no wheezes. She has no rales.  Abdominal: Soft. Bowel sounds are normal. She exhibits no mass. There is no tenderness. There is no rebound and no guarding.  Musculoskeletal:  Normal range of motion. She exhibits no tenderness.  Lymphadenopathy:    She has no cervical adenopathy.  Neurological: She is alert and oriented to person, place, and time. She displays normal reflexes. No cranial nerve deficit. She exhibits normal muscle tone. Coordination normal.  Skin: Skin is warm and dry. Capillary refill takes less than 2 seconds.  Psychiatric: She has a normal mood and affect.     ED Treatments / Results   Vitals:   02/15/17 2307 02/16/17 0209  BP: 141/97 129/78  Pulse: 97 88  Resp: 24 18  Temp: 98.7 F (37.1 C) 98.6 F (37 C)     Procedures Procedures (including critical care time)  Medications Ordered in ED  Medications  ketorolac (TORADOL) injection 60 mg (60 mg Intramuscular Given 02/16/17 0210)  metoCLOPramide (REGLAN) injection 10 mg (10 mg Intramuscular Given 02/16/17 0210)     Markedly improved in the ED post medication.  Patient informed we do not refill narcotic medication.    Final Clinical Impressions(s) / ED Diagnoses  The headache is typical of her migraines. Unknown trigger but sees a headache specialist. Narcotics are not good for migraines.  Patient is markedly improved post medication.  She has had similar in the past.  Vomiting has ceased and patient PO challenged in the ED.  There is no f/c/r. No neck pain or stiffness.  No proptosis.  No focal neuro deficits.  Intact cognition.  There is no indication for CT or Lp in this patient.    Patient is well appearing, normal vital signs.    Based on history and exam patient has been appropriately medically screened and emergency conditions excluded. Patient is stable for discharge at this time. Strict return precautions given for fever, weakness, numbness changes in vision or speech, changes in cognition, neck pain or stiffness, shortness of breath, chest pain, leg pain or swelling, intractable vomiting, worsening symptomsor anyfurther problems or concerns.  Follow up with your PMD in 2  days for recheck.  New Prescriptions New Prescriptions   No medications on file     Raffaela Ladley, MD 02/16/17 [phone]

## 2017-04-03 ENCOUNTER — Encounter (HOSPITAL_BASED_OUTPATIENT_CLINIC_OR_DEPARTMENT_OTHER): Payer: Self-pay

## 2017-04-03 ENCOUNTER — Emergency Department (HOSPITAL_BASED_OUTPATIENT_CLINIC_OR_DEPARTMENT_OTHER)
Admission: EM | Admit: 2017-04-03 | Discharge: 2017-04-03 | Disposition: A | Discharge status: Home / Self Care | Payer: Medicaid Other | Attending: Emergency Medicine | Admitting: Emergency Medicine

## 2017-04-03 DIAGNOSIS — Z7984 Long term (current) use of oral hypoglycemic drugs: Secondary | ICD-10-CM | POA: Diagnosis not present

## 2017-04-03 DIAGNOSIS — Z79899 Other long term (current) drug therapy: Secondary | ICD-10-CM | POA: Diagnosis not present

## 2017-04-03 DIAGNOSIS — G43009 Migraine without aura, not intractable, without status migrainosus: Secondary | ICD-10-CM | POA: Diagnosis not present

## 2017-04-03 DIAGNOSIS — E119 Type 2 diabetes mellitus without complications: Secondary | ICD-10-CM | POA: Insufficient documentation

## 2017-04-03 DIAGNOSIS — I10 Essential (primary) hypertension: Secondary | ICD-10-CM | POA: Insufficient documentation

## 2017-04-03 DIAGNOSIS — G43909 Migraine, unspecified, not intractable, without status migrainosus: Secondary | ICD-10-CM | POA: Diagnosis present

## 2017-04-03 MED ORDER — KETOROLAC TROMETHAMINE 60 MG/2ML IM SOLN
60.0000 mg | Freq: Once | INTRAMUSCULAR | Status: AC
  Administered 2017-04-03: 60 mg via INTRAMUSCULAR
  Filled 2017-04-03: qty 2

## 2017-04-03 MED ORDER — METOCLOPRAMIDE HCL 5 MG/ML IJ SOLN
10.0000 mg | Freq: Once | INTRAMUSCULAR | Status: DC
  Filled 2017-04-03: qty 2

## 2017-04-03 MED ORDER — DIPHENHYDRAMINE HCL 50 MG/ML IJ SOLN
25.0000 mg | Freq: Once | INTRAMUSCULAR | Status: AC
  Administered 2017-04-03: 25 mg via INTRAMUSCULAR
  Filled 2017-04-03: qty 1

## 2017-04-03 MED ORDER — METOCLOPRAMIDE HCL 5 MG/ML IJ SOLN
10.0000 mg | Freq: Once | INTRAMUSCULAR | Status: AC
  Administered 2017-04-03: 10 mg via INTRAMUSCULAR

## 2017-04-03 NOTE — ED Provider Notes (Signed)
[facility]-EMERGENCY DEPT Provider Note   CSN: [id number] Arrival date & time: 04/03/17  1306     History   Chief Complaint Chief Complaint  Patient presents with  . Migraine    HPI Cynthia Byrd is a 41 y.o. female who presents to the Emergency Department with a a HA that began one week sago. She reports a h/o of similar and states the current migrain feels like her usual migraines. She denies dizziness, lightheadedness, and weakness.   PMH includes DM Type II and migraines.    HPI  Past Medical History:  Diagnosis Date  . Diabetes mellitus without complication (HCC)   . Headache   . Hypertension     There are no active problems to display for this patient.   Past Surgical History:  Procedure Laterality Date  . DENTAL SURGERY    . FOOT SURGERY      OB History    No data available       Home Medications    Prior to Admission medications   Medication Sig Start Date End Date Taking? Authorizing Provider  amLODipine (NORVASC) 5 MG tablet Take 5 mg by mouth daily.   Yes Historical Provider, MD  aspirin-acetaminophen-caffeine (EXCEDRIN MIGRAINE) [phone] MG tablet Take by mouth every 6 (six) hours as needed for headache.   Yes Historical Provider, MD  glipiZIDE (GLUCOTROL) 10 MG tablet Take 10 mg by mouth 2 (two) times daily before a meal.   Yes Historical Provider, MD  liraglutide (VICTOZA) 18 MG/3ML SOPN Inject into the skin.   Yes Historical Provider, MD  Rizatriptan Benzoate (MAXALT PO) Take by mouth.   Yes Historical Provider, MD  Topiramate (TOPAMAX PO) Take by mouth.   Yes Historical Provider, MD  oseltamivir (TAMIFLU) 75 MG capsule Take 1 capsule (75 mg total) by mouth every 12 (twelve) hours. 01/15/17   Junius Finner, PA-C    Family History History reviewed. No pertinent family history.  Social History Social History  Substance Use Topics  . Smoking status: Never Smoker  . Smokeless tobacco: Never Used  . Alcohol use Yes     Comment: wine - social      Allergies   Codeine and Percocet [oxycodone-acetaminophen]   Review of Systems Review of Systems  Constitutional: Negative for activity change, chills and fever.  HENT: Negative for congestion.   Eyes: Negative for visual disturbance.  Respiratory: Negative for shortness of breath.   Cardiovascular: Negative for chest pain.  Gastrointestinal: Negative for abdominal pain.  Musculoskeletal: Negative for back pain.  Skin: Negative for rash.  Neurological: Positive for headaches. Negative for dizziness, tremors, seizures, syncope, facial asymmetry, speech difficulty, weakness, light-headedness and numbness.  Psychiatric/Behavioral: Negative for confusion.     Physical Exam Updated Vital Signs BP 128/72 (BP Location: Right Arm)   Pulse 84   Temp 98.6 F (37 C) (Oral)   Resp 16   Ht  (1.727 m)   Wt 120.2 kg   LMP 03/27/2017   SpO2 100%   BMI 40.29 kg/m   Physical Exam  Constitutional: She is oriented to person, place, and time. She appears well-developed and well-nourished. No distress.  HENT:  Head: Normocephalic and atraumatic.  Eyes: Conjunctivae and EOM are normal. Pupils are equal, round, and reactive to light.  Neck: Neck supple.  Cardiovascular: Normal rate and regular rhythm.  Exam reveals no gallop and no friction rub.   No murmur heard. Pulmonary/Chest: Effort normal and breath sounds normal. No respiratory distress. She has no wheezes.  She has no rales.  Abdominal: Soft. She exhibits no distension. There is no tenderness. There is no guarding.  Musculoskeletal: She exhibits no edema.  Neurological: She is alert and oriented to person, place, and time. She has normal strength. No cranial nerve deficit or sensory deficit. She exhibits normal muscle tone. Coordination and gait normal. GCS eye subscore is 4. GCS verbal subscore is 5. GCS motor subscore is 6.  Finger to nose and heel to shin intact. Patient with goal oriented sentences. No aphasia.   Skin:  Skin is warm and dry. No rash noted. She is not diaphoretic.  Psychiatric: Her behavior is normal.  Nursing note and vitals reviewed.    ED Treatments / Results  Labs (all labs ordered are listed, but only abnormal results are displayed) Labs Reviewed - No data to display  EKG  EKG Interpretation None       Radiology No results found.  Procedures Procedures (including critical care time)  Medications Ordered in ED Medications  ketorolac (TORADOL) injection 60 mg (60 mg Intramuscular Given 04/03/17 1503)  diphenhydrAMINE (BENADRYL) injection 25 mg (25 mg Intramuscular Given 04/03/17 1504)  metoCLOPramide (REGLAN) injection 10 mg (10 mg Intramuscular Given 04/03/17 1504)     Initial Impression / Assessment and Plan / ED Course  I have reviewed the triage vital signs and the nursing notes.  Pertinent labs & imaging results that were available during my care of the patient were reviewed by me and considered in my medical decision making (see chart for details).     Patient migraine treated and improved while in ED.  Presentation is like typical migraine and not concerning for [facility], ICH, meningitis, or temporal arteritis. Pt is afebrile with no focal neuro deficits, nuchal rigidity, or change in vision. VSS. Pt is to follow up with neuro to discuss prophylactic medication.  Pt verbalizes understanding and is agreeable with plan to dc.   Final Clinical Impressions(s) / ED Diagnoses   Final diagnoses:  Migraine without aura and without status migrainosus, not intractable    New Prescriptions Discharge Medication List as of 04/03/2017  4:02 PM       Michelene Keniston A Destynee Stringfellow, PA-C 04/05/17 2304    Nira Conn, MD 04/08/17 [phone]

## 2017-04-03 NOTE — ED Triage Notes (Signed)
Pt reports one week of migraine with nausea (reports history of same and this feels the same) states migraine not improved with excedrin, topamax, or maxalt. States she participated in the [place] walk yesterday and lots of stress contributing to symptoms.

## 2017-04-03 NOTE — Discharge Instructions (Signed)
Please keep taking your Topamax as prescribed. Please call neurology for follow up.   If you develop new or worsening symptoms, you can return to the Emergency Department for new or worsening symptoms.

## 2017-04-12 ENCOUNTER — Encounter: Attending: "Endocrinology | Primary: Family Medicine

## 2017-06-14 ENCOUNTER — Encounter: Attending: "Endocrinology | Primary: Family Medicine

## 2017-07-08 ENCOUNTER — Encounter: Attending: "Endocrinology | Primary: Family Medicine

## 2017-08-11 IMAGING — DX DG FOOT COMPLETE 3+V*L*
1 series · 1 of 1 positions shown · non-contrast
Comparison: None.

CLINICAL DATA: Stepped on glass yesterday. Pain at the plantar
heel.

EXAM:
LEFT FOOT - COMPLETE 3+ VIEW

[foot lat]
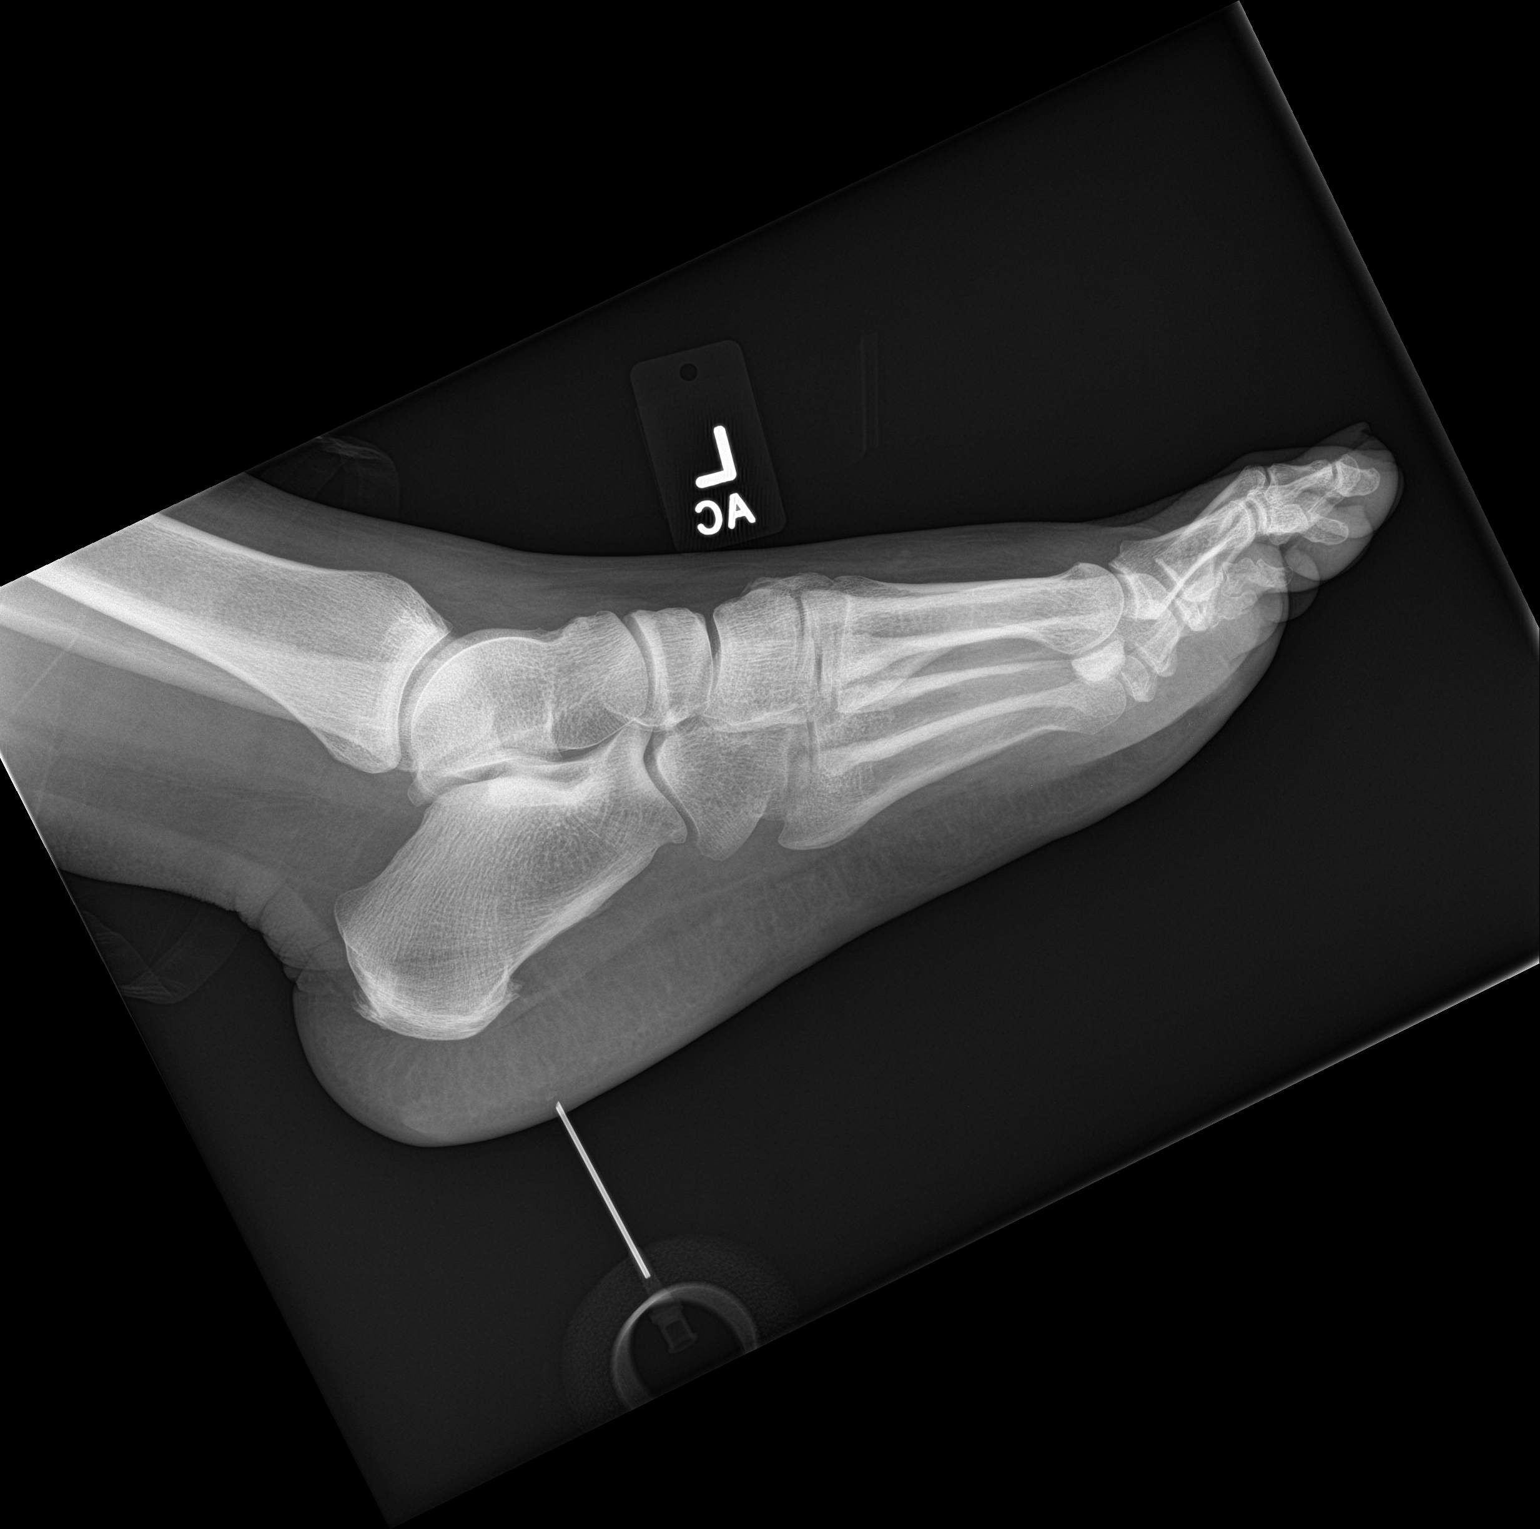

[1 of 1 positions shown; findings below may reference images not displayed]

FINDINGS: At be indicated location, there is a soft tissue foreign body with
generally triangular morphology, faintly visible and measuring
approximately 3 mm. The appearances are consistent with the
described suspicion of a glass foreign body. This is very
superficial.

No bony abnormality except for minimal calcaneal spurring. No soft
tissue gas.
IMPRESSION: 3 mm triangular foreign body at the plantar heel region consistent
with glass.

## 2017-10-15 ENCOUNTER — Other Ambulatory Visit: Payer: Self-pay

## 2017-10-15 ENCOUNTER — Emergency Department (HOSPITAL_BASED_OUTPATIENT_CLINIC_OR_DEPARTMENT_OTHER)
Admission: EM | Admit: 2017-10-15 | Discharge: 2017-10-15 | Disposition: A | Discharge status: Home / Self Care | Payer: Medicaid Other | Attending: Emergency Medicine | Admitting: Emergency Medicine

## 2017-10-15 ENCOUNTER — Encounter (HOSPITAL_BASED_OUTPATIENT_CLINIC_OR_DEPARTMENT_OTHER): Payer: Self-pay | Admitting: Emergency Medicine

## 2017-10-15 DIAGNOSIS — E119 Type 2 diabetes mellitus without complications: Secondary | ICD-10-CM | POA: Diagnosis not present

## 2017-10-15 DIAGNOSIS — R51 Headache: Secondary | ICD-10-CM | POA: Diagnosis present

## 2017-10-15 DIAGNOSIS — I10 Essential (primary) hypertension: Secondary | ICD-10-CM | POA: Diagnosis not present

## 2017-10-15 DIAGNOSIS — Z7984 Long term (current) use of oral hypoglycemic drugs: Secondary | ICD-10-CM | POA: Diagnosis not present

## 2017-10-15 DIAGNOSIS — G43009 Migraine without aura, not intractable, without status migrainosus: Secondary | ICD-10-CM | POA: Insufficient documentation

## 2017-10-15 DIAGNOSIS — Z79899 Other long term (current) drug therapy: Secondary | ICD-10-CM | POA: Insufficient documentation

## 2017-10-15 MED ORDER — DIPHENHYDRAMINE HCL 25 MG PO CAPS
ORAL_CAPSULE | ORAL | Status: AC
  Filled 2017-10-15: qty 1

## 2017-10-15 MED ORDER — DIPHENHYDRAMINE HCL 25 MG PO CAPS
50.0000 mg | ORAL_CAPSULE | Freq: Once | ORAL | Status: AC
  Administered 2017-10-15: 50 mg via ORAL
  Filled 2017-10-15: qty 2

## 2017-10-15 MED ORDER — KETOROLAC TROMETHAMINE 15 MG/ML IJ SOLN
15.0000 mg | Freq: Once | INTRAMUSCULAR | Status: AC
  Administered 2017-10-15: 15 mg via INTRAVENOUS
  Filled 2017-10-15: qty 1

## 2017-10-15 MED ORDER — METHYLPREDNISOLONE SODIUM SUCC 125 MG IJ SOLR
125.0000 mg | Freq: Once | INTRAMUSCULAR | Status: DC
  Filled 2017-10-15: qty 2

## 2017-10-15 MED ORDER — SODIUM CHLORIDE 0.9 % IV BOLUS (SEPSIS)
1000.0000 mL | Freq: Once | INTRAVENOUS | Status: AC
  Administered 2017-10-15: 1000 mL via INTRAVENOUS

## 2017-10-15 MED ORDER — METOCLOPRAMIDE HCL 5 MG/ML IJ SOLN
10.0000 mg | Freq: Once | INTRAMUSCULAR | Status: AC
  Administered 2017-10-15: 10 mg via INTRAVENOUS
  Filled 2017-10-15: qty 2

## 2017-10-15 NOTE — ED Provider Notes (Signed)
[facility] EMERGENCY DEPARTMENT Provider Note   CSN: [id number] Arrival date & time: 10/15/17  1256     History   Chief Complaint Chief Complaint  Patient presents with  . Headache    HPI Cynthia Byrd is a 41 y.o. female.  HPI 41 year old female past medical history significant for hypertension, migraines, diabetes presents to the emergency department today with complaints of a migraine headache.  Patient states that this is been there for 3 days.  Reports associated nausea, photophobia, phonophobia.  Patient states this is typical of her migraine headaches.  States that it is unrelieved by her prophylactic migraine medicine at home.  Patient states that she used to follow-up with a neurologist but does not have one in the area.  Nothing makes her symptoms better or worse.  She has not had any associated fever, chills, vision changes, neck stiffness, lightheadedness, dizziness, ataxia, slurred speech, facial droop, chest pain, shortness of breath, emesis.  Pt denies any fever, chill, vision changes, lightheadedness, dizziness, congestion, neck pain, cp, sob, cough, abd pain, n/v/d, urinary symptoms, change in bowel habits, melena, hematochezia, lower extremity paresthesias.  Past Medical History:  Diagnosis Date  . Diabetes mellitus without complication (HCC)   . Headache   . Hypertension     There are no active problems to display for this patient.   Past Surgical History:  Procedure Laterality Date  . DENTAL SURGERY    . FOOT SURGERY      OB History    No data available       Home Medications    Prior to Admission medications   Medication Sig Start Date End Date Taking? Authorizing Provider  amLODipine (NORVASC) 5 MG tablet Take 5 mg by mouth daily.   Yes [provider]  aspirin-acetaminophen-caffeine (EXCEDRIN MIGRAINE) [phone] MG tablet Take by mouth every 6 (six) hours as needed for headache.   Yes [provider]  citalopram  (CELEXA) 10 MG tablet Take 10 mg daily by mouth.   Yes [provider]  glipiZIDE (GLUCOTROL) 10 MG tablet Take 10 mg by mouth 2 (two) times daily before a meal.   Yes [provider]  liraglutide (VICTOZA) 18 MG/3ML SOPN Inject into the skin.   Yes [provider]  Rizatriptan Benzoate (MAXALT PO) Take by mouth.   Yes [provider]  Topiramate (TOPAMAX PO) Take by mouth.   Yes [provider]  oseltamivir (TAMIFLU) 75 MG capsule Take 1 capsule (75 mg total) by mouth every 12 (twelve) hours. 01/15/17   Lurene ShadowPhelps, Erin O, PA-C    Family History No family history on file.  Social History Social History   Tobacco Use  . Smoking status: Never Smoker  . Smokeless tobacco: Never Used  Substance Use Topics  . Alcohol use: Yes    Comment: wine - social  . Drug use: No     Allergies   Codeine and Percocet [oxycodone-acetaminophen]   Review of Systems Review of Systems  Constitutional: Negative for chills and fever.  HENT: Negative for congestion.   Eyes: Negative for visual disturbance.  Respiratory: Negative for cough and shortness of breath.   Cardiovascular: Negative for chest pain.  Gastrointestinal: Negative for abdominal pain, diarrhea, nausea and vomiting.  Genitourinary: Negative for dysuria, flank pain, frequency, hematuria and urgency.  Musculoskeletal: Negative for arthralgias and myalgias.  Skin: Negative for rash.  Neurological: Positive for headaches. Negative for dizziness, syncope, weakness, light-headedness and numbness.  Psychiatric/Behavioral: Negative for sleep disturbance. The  patient is not nervous/anxious.      Physical Exam Updated Vital Signs BP (!) 147/102 (BP Location: Left Arm)   Pulse 94   Temp 97.9 F (36.6 C) (Oral)   Resp 18   Ht 5' 7.5" (1.715 m)   Wt 122.5 kg (270 lb)   LMP 10/11/2017   SpO2 99%   BMI 41.66 kg/m   Physical Exam  Constitutional: She is oriented to person, place, and time. She  appears well-developed and well-nourished.  Non-toxic appearance. No distress.  HENT:  Head: Normocephalic and atraumatic.  Nose: Nose normal.  Mouth/Throat: Oropharynx is clear and moist.  Eyes: Conjunctivae and EOM are normal. Pupils are equal, round, and reactive to light. Right eye exhibits no discharge. Left eye exhibits no discharge.  Neck: Normal range of motion. Neck supple.  No c spine midline tenderness. No paraspinal tenderness. No deformities or step offs noted. Full ROM. Supple. No nuchal rigidity.    Cardiovascular: Normal rate.  Pulmonary/Chest: Effort normal. No respiratory distress.  Abdominal: Soft. Bowel sounds are normal. There is no tenderness. There is no rebound and no guarding.  Musculoskeletal: Normal range of motion. She exhibits no tenderness.  Lymphadenopathy:    She has no cervical adenopathy.  Neurological: She is alert and oriented to person, place, and time.  The patient is alert, attentive, and oriented x 3. Speech is clear. Cranial nerve II-VII grossly intact. Negative pronator drift. Sensation intact. Strength 5/5 in all extremities. Reflexes 2+ and symmetric at biceps, triceps, knees, and ankles. Rapid alternating movement and fine finger movements intact. Romberg is absent. Posture and gait normal.   Skin: Skin is warm and dry. Capillary refill takes less than 2 seconds.  Psychiatric: Her behavior is normal. Judgment and thought content normal.  Nursing note and vitals reviewed.    ED Treatments / Results  Labs (all labs ordered are listed, but only abnormal results are displayed) Labs Reviewed - No data to display  EKG  EKG Interpretation None       Radiology No results found.  Procedures Procedures (including critical care time)  Medications Ordered in ED Medications  sodium chloride 0.9 % bolus 1,000 mL (1,000 mLs Intravenous New Bag/Given 10/15/17 1328)  metoCLOPramide (REGLAN) injection 10 mg (10 mg Intravenous Given 10/15/17  1325)  diphenhydrAMINE (BENADRYL) capsule 50 mg (50 mg Oral Given 10/15/17 1318)  ketorolac (TORADOL) 15 MG/ML injection 15 mg (15 mg Intravenous Given 10/15/17 1322)     Initial Impression / Assessment and Plan / ED Course  I have reviewed the triage vital signs and the nursing notes.  Pertinent labs & imaging results that were available during my care of the patient were reviewed by me and considered in my medical decision making (see chart for details).     Pt HA treated and improved while in ED.  Presentation is like pts typical HA and non concerning for [facility], ICH, Meningitis, or temporal arteritis. Pt is afebrile with no focal neuro deficits, nuchal rigidity, or change in vision. Pt is to follow up with PCP to discuss prophylactic medication. Pt verbalizes understanding and is agreeable with plan to dc.    Final Clinical Impressions(s) / ED Diagnoses   Final diagnoses:  Migraine without aura and without status migrainosus, not intractable    ED Discharge Orders    None       Wallace KellerLeaphart, Kenneth T, PA-C 10/15/17 1414    Mesner, Barbara CowerJason, MD 10/16/17 [phone]

## 2017-10-15 NOTE — ED Triage Notes (Signed)
Headache x 3 days with nausea and photophobia. Hx of migraines.

## 2017-10-15 NOTE — Discharge Instructions (Signed)
You have been treated for a migraine in the emergency department.  Have given you a referral to neurology.  They will call you with 1-2 weeks for follow-up.  Take your prophylactic medicine at home as needed.  Follow-up with primary care doctor.  Return to the ED if you develop any worsening symptoms.

## 2018-01-14 ENCOUNTER — Other Ambulatory Visit: Payer: Self-pay

## 2018-01-14 ENCOUNTER — Encounter (HOSPITAL_BASED_OUTPATIENT_CLINIC_OR_DEPARTMENT_OTHER): Payer: Self-pay | Admitting: Emergency Medicine

## 2018-01-14 ENCOUNTER — Emergency Department (HOSPITAL_BASED_OUTPATIENT_CLINIC_OR_DEPARTMENT_OTHER)
Admission: EM | Admit: 2018-01-14 | Discharge: 2018-01-14 | Disposition: A | Discharge status: Home / Self Care | Payer: Medicaid Other | Attending: Emergency Medicine | Admitting: Emergency Medicine

## 2018-01-14 DIAGNOSIS — X58XXXA Exposure to other specified factors, initial encounter: Secondary | ICD-10-CM | POA: Insufficient documentation

## 2018-01-14 DIAGNOSIS — E119 Type 2 diabetes mellitus without complications: Secondary | ICD-10-CM | POA: Diagnosis not present

## 2018-01-14 DIAGNOSIS — I10 Essential (primary) hypertension: Secondary | ICD-10-CM | POA: Diagnosis not present

## 2018-01-14 DIAGNOSIS — Y929 Unspecified place or not applicable: Secondary | ICD-10-CM | POA: Insufficient documentation

## 2018-01-14 DIAGNOSIS — Z79899 Other long term (current) drug therapy: Secondary | ICD-10-CM | POA: Diagnosis not present

## 2018-01-14 DIAGNOSIS — T148XXA Other injury of unspecified body region, initial encounter: Secondary | ICD-10-CM

## 2018-01-14 DIAGNOSIS — Y999 Unspecified external cause status: Secondary | ICD-10-CM | POA: Diagnosis not present

## 2018-01-14 DIAGNOSIS — Y939 Activity, unspecified: Secondary | ICD-10-CM | POA: Diagnosis not present

## 2018-01-14 DIAGNOSIS — S0100XA Unspecified open wound of scalp, initial encounter: Secondary | ICD-10-CM | POA: Diagnosis not present

## 2018-01-14 DIAGNOSIS — S0990XA Unspecified injury of head, initial encounter: Secondary | ICD-10-CM | POA: Diagnosis present

## 2018-01-14 NOTE — ED Notes (Signed)
Denies pain but c/o irritation to behind her ears.  Open skin areas after hair dye for 3 weeks now.

## 2018-01-14 NOTE — ED Triage Notes (Signed)
Pt reports rash behind both ears x 2 weeks. Pt reports dying her hair just before she noticed it. Pt endorses itching and denies pain.

## 2018-01-14 NOTE — Discharge Instructions (Signed)
Wash the areas behind her ears daily with soap and water and place a thin layer of Neosporin ointment over the wounds 4 times daily.  Discontinue using peroxide on the wound.  If your wounds are not better behind your years after 2 or 3 weeks.  Call Dr. Terri PiedraLupton to schedule an appointment your blood pressure was elevated today at 143/103.  It should be rechecked in 3 weeks at your doctor's office or at an urgent care center

## 2018-01-14 NOTE — ED Provider Notes (Signed)
[facility] EMERGENCY DEPARTMENT Provider Note   CSN: [id number] Arrival date & time: 01/14/18  1224     History   Chief Complaint Chief Complaint  Patient presents with  . Rash    HPI Cynthia Byrd is a 41 y.o. female.  HPI Patient reports that she has had irritated itchy area behind both areas but for the past 3 weeks.  No fever.  She has been treating the areas topically with hydrogen peroxide and with Neosporin ointment.  She states that she continues to get worse.  With no other associated symptoms.  Nothing makes symptoms better or worse.  No fever.  She reports that she had her hair dyed 3 weeks ago but has been dying her hair for several years. Past Medical History:  Diagnosis Date  . Diabetes mellitus without complication (HCC)   . Headache   . Hypertension     There are no active problems to display for this patient.   Past Surgical History:  Procedure Laterality Date  . DENTAL SURGERY    . FOOT SURGERY      OB History    No data available       Home Medications    Prior to Admission medications   Medication Sig Start Date End Date Taking? Authorizing Provider  amLODipine (NORVASC) 5 MG tablet Take 5 mg by mouth daily.   Yes [provider]  aspirin-acetaminophen-caffeine (EXCEDRIN MIGRAINE) [phone] MG tablet Take by mouth every 6 (six) hours as needed for headache.   Yes [provider]  citalopram (CELEXA) 10 MG tablet Take 10 mg daily by mouth.   Yes [provider]  liraglutide (VICTOZA) 18 MG/3ML SOPN Inject into the skin.   Yes [provider]  Rizatriptan Benzoate (MAXALT PO) Take by mouth.   Yes [provider]  Topiramate (TOPAMAX PO) Take by mouth.   Yes [provider]  glipiZIDE (GLUCOTROL) 10 MG tablet Take 10 mg by mouth 2 (two) times daily before a meal.    [provider]  oseltamivir (TAMIFLU) 75 MG capsule Take 1 capsule (75 mg total) by mouth every 12 (twelve)  hours. 01/15/17   Lurene Shadow, PA-C    Family History No family history on file.  Social History Social History   Tobacco Use  . Smoking status: Never Smoker  . Smokeless tobacco: Never Used  Substance Use Topics  . Alcohol use: Yes    Comment: wine - social  . Drug use: No     Allergies   Codeine and Percocet [oxycodone-acetaminophen]   Review of Systems Review of Systems  Skin: Positive for rash.       Irritated skin behind both ears  Allergic/Immunologic: Positive for immunocompromised state.       Diabetic     Physical Exam Updated Vital Signs BP 135/86 (BP Location: Left Arm)   Pulse 88   Temp 98.2 F (36.8 C) (Oral)   Resp 16   Ht 5\' 8"  (1.727 m)   Wt 127 kg (280 lb)   LMP 01/04/2018   SpO2 98%   BMI 42.57 kg/m   Physical Exam  Constitutional: She appears well-developed and well-nourished. No distress.  HENT:  There is a 2 cm open wound behind  ear where external ear meets scalp.  External ear appears normal.  There is a 1 cm open wound behind left ear, where  where external ear meets the scalp.  Wounds are clean appearing there is no surrounding redness  or tenderness.  Otherwise normocephalic atraumatic  Eyes: Conjunctivae and EOM are normal.  Neck: Neck supple.  Cardiovascular: Normal rate.  Pulmonary/Chest: Effort normal.  Abdominal:  Obese  Skin: Skin is warm and dry.  Psychiatric: She has a normal mood and affect.     ED Treatments / Results  Labs (all labs ordered are listed, but only abnormal results are displayed) Labs Reviewed - No data to display  EKG  EKG Interpretation None       Radiology No results found.  Procedures Procedures (including critical care time)  Medications Ordered in ED Medications - No data to display   Initial Impression / Assessment and Plan / ED Course  I have reviewed the triage vital signs and the nursing notes.  Pertinent labs & imaging results that were available during my care of the  patient were reviewed by me and considered in my medical decision making (see chart for details).     I suspect the patient has dried out her skin using peroxide.  I have advised her to stop peroxide continue Neosporin.  She will be referred to dermatology Dr. Terri PiedraLupton if not better in 2-3 weeks.  I doubt this is a reaction to hair dye.  Blood pressure recheck 3 weeks  Final Clinical Impressions(s) / ED Diagnoses  Dx open wounds Final diagnoses:  None  #2 elevated blood pressure   ED Discharge Orders    None       Doug SouJacubowitz, Anjuli Gemmill, MD 01/14/18 1406

## 2018-03-18 ENCOUNTER — Emergency Department (HOSPITAL_BASED_OUTPATIENT_CLINIC_OR_DEPARTMENT_OTHER)
Admission: EM | Admit: 2018-03-18 | Discharge: 2018-03-18 | Disposition: A | Discharge status: Home / Self Care | Payer: Medicaid Other | Attending: Emergency Medicine | Admitting: Emergency Medicine

## 2018-03-18 ENCOUNTER — Encounter (HOSPITAL_BASED_OUTPATIENT_CLINIC_OR_DEPARTMENT_OTHER): Payer: Self-pay | Admitting: Adult Health

## 2018-03-18 ENCOUNTER — Other Ambulatory Visit: Payer: Self-pay

## 2018-03-18 DIAGNOSIS — E119 Type 2 diabetes mellitus without complications: Secondary | ICD-10-CM | POA: Diagnosis not present

## 2018-03-18 DIAGNOSIS — Z79899 Other long term (current) drug therapy: Secondary | ICD-10-CM | POA: Diagnosis not present

## 2018-03-18 DIAGNOSIS — I10 Essential (primary) hypertension: Secondary | ICD-10-CM | POA: Insufficient documentation

## 2018-03-18 DIAGNOSIS — G43009 Migraine without aura, not intractable, without status migrainosus: Secondary | ICD-10-CM | POA: Diagnosis not present

## 2018-03-18 DIAGNOSIS — R51 Headache: Secondary | ICD-10-CM | POA: Diagnosis present

## 2018-03-18 MED ORDER — SODIUM CHLORIDE 0.9 % IV BOLUS
1000.0000 mL | Freq: Once | INTRAVENOUS | Status: AC
  Administered 2018-03-18: 1000 mL via INTRAVENOUS

## 2018-03-18 MED ORDER — PROCHLORPERAZINE EDISYLATE 5 MG/ML IJ SOLN
10.0000 mg | Freq: Once | INTRAMUSCULAR | Status: AC
  Administered 2018-03-18: 10 mg via INTRAVENOUS
  Filled 2018-03-18: qty 2

## 2018-03-18 MED ORDER — KETOROLAC TROMETHAMINE 30 MG/ML IJ SOLN
30.0000 mg | Freq: Once | INTRAMUSCULAR | Status: AC
  Administered 2018-03-18: 30 mg via INTRAVENOUS
  Filled 2018-03-18: qty 1

## 2018-03-18 MED ORDER — DIPHENHYDRAMINE HCL 50 MG/ML IJ SOLN
25.0000 mg | Freq: Once | INTRAMUSCULAR | Status: AC
  Administered 2018-03-18: 25 mg via INTRAVENOUS
  Filled 2018-03-18: qty 1

## 2018-03-18 NOTE — ED Notes (Signed)
Pt states her friend is driving her home.

## 2018-03-18 NOTE — ED Provider Notes (Signed)
[facility] EMERGENCY DEPARTMENT Provider Note   CSN: [id number] Arrival date & time: 03/18/18  1835     History   Chief Complaint Chief Complaint  Patient presents with  . Migraine    HPI Cynthia Byrd is a 42 y.o. female.  HPI Cynthia Byrd is a 42 y.o. female presents to ED with complaint of migraine headache. States symptoms started 5 days ago. Taking Excedrin, Topamax, Fioricet with no relief of headache. Associated photophobia. No n/v. Reports some neck pain. Pain is left sided. Hx of similar headaches in the past. Has an apt with neurologist next week.  No fever or chills.  No head injury.  No neck  stiffness.  No numbness or weakness in extremities.  No other complaints  Past Medical History:  Diagnosis Date  . Diabetes mellitus without complication (HCC)   . Headache   . Hypertension     There are no active problems to display for this patient.   Past Surgical History:  Procedure Laterality Date  . DENTAL SURGERY    . FOOT SURGERY       OB History   None      Home Medications    Prior to Admission medications   Medication Sig Start Date End Date Taking? Authorizing Provider  amLODipine (NORVASC) 5 MG tablet Take 5 mg by mouth daily.    [provider]  aspirin-acetaminophen-caffeine (EXCEDRIN MIGRAINE) [phone] MG tablet Take by mouth every 6 (six) hours as needed for headache.    [provider]  citalopram (CELEXA) 10 MG tablet Take 10 mg daily by mouth.    [provider]  glipiZIDE (GLUCOTROL) 10 MG tablet Take 10 mg by mouth 2 (two) times daily before a meal.    [provider]  liraglutide (VICTOZA) 18 MG/3ML SOPN Inject into the skin.    [provider]  oseltamivir (TAMIFLU) 75 MG capsule Take 1 capsule (75 mg total) by mouth every 12 (twelve) hours. 01/15/17   Lurene ShadowPhelps, Erin O, PA-C  Rizatriptan Benzoate (MAXALT PO) Take by mouth.    [provider]  Topiramate (TOPAMAX PO) Take by mouth.     [provider]    Family History History reviewed. No pertinent family history.  Social History Social History   Tobacco Use  . Smoking status: Never Smoker  . Smokeless tobacco: Never Used  Substance Use Topics  . Alcohol use: Yes    Comment: wine - social  . Drug use: No     Allergies   Codeine and Percocet [oxycodone-acetaminophen]   Review of Systems Review of Systems  Constitutional: Negative for chills and fever.  Eyes: Positive for photophobia. Negative for pain and visual disturbance.  Respiratory: Negative for cough, chest tightness and shortness of breath.   Cardiovascular: Negative for chest pain, palpitations and leg swelling.  Gastrointestinal: Negative for abdominal pain, diarrhea, nausea and vomiting.  Genitourinary: Negative for dysuria, flank pain, pelvic pain, vaginal bleeding, vaginal discharge and vaginal pain.  Musculoskeletal: Negative for arthralgias, myalgias, neck pain and neck stiffness.  Skin: Negative for rash.  Neurological: Positive for headaches. Negative for dizziness and weakness.  All other systems reviewed and are negative.    Physical Exam Updated Vital Signs Ht 5\' 8"  (1.727 m)   Wt 127 kg (280 lb)   LMP 03/02/2018 (Approximate)   BMI 42.57 kg/m   Physical Exam  Constitutional: She is oriented to person, place, and time. She appears well-developed and well-nourished. No distress.  HENT:  Head: Normocephalic.  Eyes: Pupils are equal, round, and reactive to light. Conjunctivae and EOM are normal.  Neck: Neck supple.  Cardiovascular: Normal rate, regular rhythm and normal heart sounds.  Pulmonary/Chest: Effort normal and breath sounds normal. No respiratory distress. She has no wheezes. She has no rales.  Abdominal: Soft. Bowel sounds are normal. She exhibits no distension. There is no tenderness. There is no rebound.  Musculoskeletal: She exhibits no edema.  Neurological: She is alert and oriented to person, place,  and time.  Skin: Skin is warm and dry.  Psychiatric: She has a normal mood and affect. Her behavior is normal.  Nursing note and vitals reviewed.    ED Treatments / Results  Labs (all labs ordered are listed, but only abnormal results are displayed) Labs Reviewed - No data to display  EKG None  Radiology No results found.  Procedures Procedures (including critical care time)  Medications Ordered in ED Medications  ketorolac (TORADOL) 30 MG/ML injection 30 mg (has no administration in time range)  prochlorperazine (COMPAZINE) injection 10 mg (has no administration in time range)  diphenhydrAMINE (BENADRYL) injection 25 mg (has no administration in time range)  sodium chloride 0.9 % bolus 1,000 mL (has no administration in time range)     Initial Impression / Assessment and Plan / ED Course  I have reviewed the triage vital signs and the nursing notes.  Pertinent labs & imaging results that were available during my care of the patient were reviewed by me and considered in my medical decision making (see chart for details).     Patient with history of migraines, here with a headache that similar to prior migraines.  No relief at home with Excedrin, Topamax, Fioricet.  Will start an IV and order IV medications for her headache.  7:50 PM Patient feels much better.  Her headache has resolved.  Will discharge home with close outpatient follow-up.  Patient is comfortable going home and will call her neurologist on Monday.  Return precautions discussed.  Vitals:   03/18/18 1917 03/18/18 1918 03/18/18 1930 03/18/18 1945  Pulse: 69 64 78 74  SpO2: 99% 99% 99% 98%  Weight:      Height:         Final Clinical Impressions(s) / ED Diagnoses   Final diagnoses:  Migraine without aura and without status migrainosus, not intractable    ED Discharge Orders    None       Jaynie Crumble, PA-C 03/20/18 1358    Jacalyn Lefevre, MD 03/23/18 [phone]

## 2018-03-18 NOTE — Discharge Instructions (Signed)
Please follow up

## 2018-03-18 NOTE — ED Triage Notes (Signed)
PResents with migraine headache since Tuesday, no relief from Fiorecet, topiramate or excedrin. Associated with light and sound sensitivity. Denies nausea, blurry vision, dizziness. HEadache is on left side of head.

## 2018-05-25 LAB — AMB EXT LDL-C
LDL-C, External: 139
LDL-C, External: 139 NA

## 2018-05-25 LAB — HM PAP SMEAR

## 2018-07-23 ENCOUNTER — Encounter (HOSPITAL_BASED_OUTPATIENT_CLINIC_OR_DEPARTMENT_OTHER): Payer: Self-pay | Admitting: Emergency Medicine

## 2018-07-23 ENCOUNTER — Other Ambulatory Visit: Payer: Self-pay

## 2018-07-23 ENCOUNTER — Emergency Department (HOSPITAL_BASED_OUTPATIENT_CLINIC_OR_DEPARTMENT_OTHER)
Admission: EM | Admit: 2018-07-23 | Discharge: 2018-07-23 | Disposition: A | Discharge status: Home / Self Care | Payer: Medicaid Other | Attending: Emergency Medicine | Admitting: Emergency Medicine

## 2018-07-23 DIAGNOSIS — R51 Headache: Secondary | ICD-10-CM | POA: Insufficient documentation

## 2018-07-23 DIAGNOSIS — R519 Headache, unspecified: Secondary | ICD-10-CM

## 2018-07-23 DIAGNOSIS — I10 Essential (primary) hypertension: Secondary | ICD-10-CM | POA: Insufficient documentation

## 2018-07-23 DIAGNOSIS — Z7984 Long term (current) use of oral hypoglycemic drugs: Secondary | ICD-10-CM | POA: Diagnosis not present

## 2018-07-23 DIAGNOSIS — Z79899 Other long term (current) drug therapy: Secondary | ICD-10-CM | POA: Insufficient documentation

## 2018-07-23 DIAGNOSIS — E119 Type 2 diabetes mellitus without complications: Secondary | ICD-10-CM | POA: Insufficient documentation

## 2018-07-23 MED ORDER — KETOROLAC TROMETHAMINE 15 MG/ML IJ SOLN
15.0000 mg | Freq: Once | INTRAMUSCULAR | Status: AC
  Administered 2018-07-23: 15 mg via INTRAVENOUS
  Filled 2018-07-23: qty 1

## 2018-07-23 MED ORDER — DIPHENHYDRAMINE HCL 50 MG/ML IJ SOLN
25.0000 mg | Freq: Once | INTRAMUSCULAR | Status: AC
  Administered 2018-07-23: 25 mg via INTRAVENOUS
  Filled 2018-07-23: qty 1

## 2018-07-23 MED ORDER — PROCHLORPERAZINE EDISYLATE 10 MG/2ML IJ SOLN
10.0000 mg | Freq: Once | INTRAMUSCULAR | Status: AC
  Administered 2018-07-23: 10 mg via INTRAVENOUS
  Filled 2018-07-23: qty 2

## 2018-07-23 NOTE — ED Triage Notes (Signed)
Headache x 3 days with history of migraines. Has tried usual meds without relief.

## 2018-07-23 NOTE — Discharge Instructions (Signed)
Please follow-up with neurologist at the next appointment for further evaluation of your migraines.If you symptoms worsen or you experience any chest pain,weakness or shortness of breath please return to the ED.

## 2018-07-23 NOTE — ED Provider Notes (Signed)
[facility] EMERGENCY DEPARTMENT Provider Note   CSN: [id number] Arrival date & time: 07/23/18  1259     History   Chief Complaint Chief Complaint  Patient presents with  . Headache    HPI Cynthia Byrd is a 42 y.o. female.  42 y.o female with a PMH of Migraines,DM and HTN presents to the ED with a chief complaint of headache x 4 days.Patient describes the headache as throbbing along the front and back of her head. She states she usually gets headaches around her cycle, she states is currently on her cycle.Patient states she has taken topomax and has had no relieve in symptoms. Patient states she was in [place] yesterday and felt her headache worsen. Patient has been referred to a neurologist by her PCP for further evaluation of headaches. She also reports photophobia but no phonophobia. She denies any vomiting, nausea, fever, chest pain or shortness of breath.  Patient also reports she recently got a new prescription for her glasses last week and has been using them frequeently, she is unsure wether this worsen her symptoms.      Past Medical History:  Diagnosis Date  . Diabetes mellitus without complication (HCC)   . Headache   . Hypertension     There are no active problems to display for this patient.   Past Surgical History:  Procedure Laterality Date  . DENTAL SURGERY    . FOOT SURGERY       OB History   None      Home Medications    Prior to Admission medications   Medication Sig Start Date End Date Taking? Authorizing Provider  amLODipine (NORVASC) 5 MG tablet Take 5 mg by mouth daily.    [provider]  aspirin-acetaminophen-caffeine (EXCEDRIN MIGRAINE) [phone] MG tablet Take by mouth every 6 (six) hours as needed for headache.    [provider]  citalopram (CELEXA) 10 MG tablet Take 10 mg daily by mouth.    [provider]  glipiZIDE (GLUCOTROL) 10 MG tablet Take 10 mg by mouth 2 (two) times daily before a meal.     [provider]  liraglutide (VICTOZA) 18 MG/3ML SOPN Inject into the skin.    [provider]  oseltamivir (TAMIFLU) 75 MG capsule Take 1 capsule (75 mg total) by mouth every 12 (twelve) hours. 01/15/17   Lurene ShadowPhelps, Erin O, PA-C  Rizatriptan Benzoate (MAXALT PO) Take by mouth.    [provider]  Topiramate (TOPAMAX PO) Take by mouth.    [provider]    Family History No family history on file.  Social History Social History   Tobacco Use  . Smoking status: Never Smoker  . Smokeless tobacco: Never Used  Substance Use Topics  . Alcohol use: Yes    Comment: wine - social  . Drug use: No     Allergies   Codeine and Percocet [oxycodone-acetaminophen]   Review of Systems Review of Systems  Constitutional: Negative for chills and fever.  HENT: Negative for ear pain and sore throat.   Eyes: Negative for pain and visual disturbance.  Respiratory: Negative for cough and shortness of breath.   Cardiovascular: Negative for chest pain and palpitations.  Gastrointestinal: Negative for abdominal pain and vomiting.  Genitourinary: Negative for dysuria and hematuria.  Musculoskeletal: Negative for arthralgias and back pain.  Skin: Negative for color change and rash.  Neurological: Positive for headaches. Negative for seizures and syncope.  All other systems reviewed and are negative.  Physical Exam Updated Vital Signs BP (!) 164/104 (BP Location: Left Arm)   Pulse 92   Temp 98.4 F (36.9 C) (Oral)   Resp 18   Ht 5' 7.5" (1.715 m)   Wt 118.8 kg   LMP 07/17/2018   SpO2 100%   BMI 40.43 kg/m   Physical Exam  Constitutional: She is oriented to person, place, and time. She appears well-developed and well-nourished.  HENT:  Head: Normocephalic and atraumatic.  Eyes: Pupils are equal, round, and reactive to light. Conjunctivae, EOM and lids are normal. Right conjunctiva is not injected. Right conjunctiva has no hemorrhage. Left  conjunctiva is not injected. Left conjunctiva has no hemorrhage. Right eye exhibits normal extraocular motion and no nystagmus. Left eye exhibits normal extraocular motion and no nystagmus. Right pupil is round and reactive. Left pupil is round and reactive. Pupils are equal.  Neck: Normal range of motion. Neck supple.  Cardiovascular: Normal heart sounds.  Pulmonary/Chest: Effort normal and breath sounds normal. She has no wheezes.  Abdominal: Soft. Bowel sounds are normal.  Musculoskeletal: She exhibits no edema or tenderness.  Neurological: She is alert and oriented to person, place, and time. She has normal strength. No sensory deficit. GCS eye subscore is 4. GCS verbal subscore is 5. GCS motor subscore is 6.  CN II-CN XII neurologically intact.   Skin: Skin is warm and dry. Capillary refill takes less than 2 seconds.  Nursing note and vitals reviewed.    ED Treatments / Results  Labs (all labs ordered are listed, but only abnormal results are displayed) Labs Reviewed - No data to display  EKG None  Radiology No results found.  Procedures Procedures (including critical care time)  Medications Ordered in ED Medications  ketorolac (TORADOL) 15 MG/ML injection 15 mg (15 mg Intravenous Given 07/23/18 1337)  diphenhydrAMINE (BENADRYL) injection 25 mg (25 mg Intravenous Given 07/23/18 1336)  prochlorperazine (COMPAZINE) injection 10 mg (10 mg Intravenous Given 07/23/18 1336)     Initial Impression / Assessment and Plan / ED Course  I have reviewed the triage vital signs and the nursing notes.  Pertinent labs & imaging results that were available during my care of the patient were reviewed by me and considered in my medical decision making (see chart for details).     Patient presents with headache x 4 days.She has a previous history of migraines. Patient denies any visual changes but reports she is sensitive to light.  She does not report any temporal tenderness or jaw pain, I  believe this is less likely temporal arteritis.  She denies any trauma and neuro exam is reassuring, leave this is less likely to be an intracranial hemorrhage.  Patient denies any fever, AMS, seizure activity I believe this is less likely to be meningitis or encephalitis.  Patient was given headache cocktail, she states she feels better and her headache has improved.  And has a follow-up appointment with a neurologist at the end of the month who will further assess her for her migraines.  I have discussed with patient at length importance of her follow-up.  Return precautions provided.  Patient received fluids and Headache cocktail she states she is feeling much better, vitals stable for discharge. Patient stable for discharge.   Final Clinical Impressions(s) / ED Diagnoses   Final diagnoses:  Bad headache    ED Discharge Orders    None       Claude MangesSoto, Merril Nagy, PA-C 07/23/18 1449    Loren RacerYelverton, David, MD 07/25/18 1726

## 2018-07-31 ENCOUNTER — Encounter

## 2018-07-31 ENCOUNTER — Ambulatory Visit
Admit: 2018-07-31 | Discharge: 2018-07-31 | Payer: PRIVATE HEALTH INSURANCE | Attending: "Endocrinology | Primary: Family Medicine

## 2018-07-31 ENCOUNTER — Ambulatory Visit: Attending: "Endocrinology | Primary: Family Medicine

## 2018-07-31 DIAGNOSIS — E1165 Type 2 diabetes mellitus with hyperglycemia: Secondary | ICD-10-CM

## 2018-07-31 MED ORDER — GLIMEPIRIDE 4 MG TAB
4 mg | ORAL_TABLET | ORAL | 3 refills | Status: DC
Start: 2018-07-31 — End: 2019-10-17

## 2018-07-31 MED ORDER — LANCETS 33 GAUGE
33 gauge | 3 refills | Status: AC
Start: 2018-07-31 — End: ?

## 2018-07-31 MED ORDER — BLOOD-GLUCOSE METER
0 refills | Status: DC
Start: 2018-07-31 — End: 2019-10-17

## 2018-07-31 MED ORDER — BLOOD SUGAR DIAGNOSTIC TEST STRIPS
ORAL_STRIP | 3 refills | Status: DC
Start: 2018-07-31 — End: 2021-09-01

## 2018-07-31 MED ORDER — METFORMIN SR 750 MG 24 HR TABLET
750 mg | ORAL_TABLET | Freq: Two times a day (BID) | ORAL | 3 refills | Status: DC
Start: 2018-07-31 — End: 2019-10-17

## 2018-07-31 NOTE — Progress Notes (Signed)
Order placed for pt,  per Verbal Order with read back from Dr Muthyala 07/31/2018

## 2018-07-31 NOTE — Progress Notes (Signed)
Progress Notes by Debe CoderMuthyala, Lakayla Barrington Devi, MD at 07/31/18 0900                Author: Debe CoderMuthyala, Nylee Barbuto Devi, MD  Service: --  Author Type: Physician       Filed: 07/31/18 2240  Encounter Date: 07/31/2018  Status: Signed          Editor: Simpson Paulos, Jackey LogeUma Devi, MD (Physician)                  [facility] [facility]                 Dennison MascotUma  Nevaen Tredway ,MD          [address] [place] [id number] [phone] Fax [phone]                     Patient Information   Date:07/31/2018   Name : Sandy King 42 y.o.      D.O.B : 1976-01-16           Referred by: Durward FortesHaffizulla, Hope A, MD                 Chief Complaint       Patient presents with        ?  New Patient             referred by Dr. Redmond Schoolhaffizulla for DM           History of Present Illness: Sandy King is a 42 y.o. female  here for initial visit of   Type 2 Diabetes Mellitus.       Type 2 Diabetes was diagnosed 2012 . End organ effects of diabetes:  none.     Cardiovascular risk factors: family history    Monitoring frequency: None,   Last A1c was high, has polyuria, no polydipsia   She is on farxiga, Victoza, Janumet   No weight loss with Victoza according to her.   She drinks [place] dry and mostly water.  She is well aware of the importance of the diet and nutrition.  Weight has been fluctuating.  No hypoglycemia.   She had dietitian visit when she had gestational diabetes mellitus   She has started walking   No known pancreatitis, no chest pain, shortness of breath        Wt Readings from Last 3 Encounters:        07/31/18  275 lb (124.7 kg)        10/05/12  288 lb (130.6 kg)             BP Readings from Last 3 Encounters:        07/31/18  138/85        10/05/12  (!) 138/96                   Past Medical History:        Diagnosis  Date         ?  Depression       ?  Diabetes (HCC)       ?  Headache(784.0)           ?  Hypertension            Current Outpatient Medications        Medication  Sig         ?  dapagliflozin (FARXIGA) 5 mg tab  tablet  Take  by mouth daily.     ?  SITagliptin-metFORMIN (JANUMET XR) 50-1,000 mg TM24  Take 2 Tabs by mouth daily.     ?  lisinopril (PRINIVIL, ZESTRIL) 10 mg tablet  Take  by mouth daily.     ?  citalopram (CELEXA) 20 mg tablet  Take 20 mg by mouth daily.     ?  amLODIPine (NORVASC) 10 mg tablet  Take 10 mg by mouth daily.     ?  Liraglutide (VICTOZA) 0.6 mg/0.1 mL (18 mg/3 mL) sub-q pen  1.8 mg by SubCUTAneous route daily.     ?  metFORMIN (GLUCOPHAGE) 500 mg tablet  Take  by mouth two (2) times daily (with meals).     ?  topiramate (TOPAMAX) 50 mg tablet  Take  by mouth daily.     ?  ketorolac (TORADOL) 10 mg tablet  Take  by mouth. q6h as need up to 5 days.         ?  rizatriptan (MAXALT-MLT) 10 mg disintegrating tablet  1 at HA onset and repeat in 2 hours x 1 prn          No current facility-administered medications for this visit.           Allergies        Allergen  Reactions         ?  Codeine  Other (comments)             dizziness         ?  Percocet [Oxycodone-Acetaminophen]  Other (comments)             dizziness           Review of Systems:  All 10 systems reviewed and are negative  other than mentioned in HPI      Physical Examination:    Blood pressure 138/85, pulse 86, temperature 97.8  ??F (36.6 ??C), temperature source Oral, resp. rate 14, height 5\' 8"  (1.727 m), weight 275 lb (124.7 kg), SpO2 100 %. Estimated body mass  index is 41.81 kg/m?? as calculated from the following:   -    Height as of this encounter: 5\' 8"  (1.727 m).   -    Weight as of this encounter: 275 lb (124.7 kg).   -  General: pleasant, no distress, good eye contact   -  HEENT: no pallor, no periorbital edema, EOMI   -  Neck: supple, no thyromegaly, no nodules   -  Cardiovascular: regular,  normal S1 and S2, no murmurs   -  Respiratory: clear to auscultation bilaterally   -  Gastrointestinal: soft, nontender, nondistended,  BS +   -  Musculoskeletal: no proximal muscle weakness in upper or lower extremities   -  Integumentary: No  edema   -  Neurological: alert and oriented   -  Psychiatric: normal mood and affect   -  Skin: color, texture, turgor normal.       Diabetic foot exam: August 2019      Left:      Vibratory sensation absent    Filament test decreased sensation with micro filament    Pulse DP: 1+     Deformities: None   Right:     Vibratory sensation absent    Filament test decreased sensation with micro filament    Pulse DP: 1+    Deformities: None              Data Reviewed:          No results  found for: HBA1C, HBA1CEXT, HGBE8, GLU, GESTF, GLUCPOC, MCACR, MCA1, MCA2, MCA3, MCAU, LDL, LDLC, DLDLP, CRES, CREAPOC, ACREA, CREA, REFC3, REFC4,  HBA1CEXT    No results found for: GFRNA, GFRNAPOC, GFRAA, GFRAAPOC, CREA, CREAPOC, BUN, IBUN, BUNPOC, NA, NAPOC, K, KPOCT, CL, CLPOC, CO2, CO2POC, MG, PHOS, ALBEU, PTH, PTHILT, EPO      Assessment/Plan:       No diagnosis found.      1. Type 2 Diabetes Mellitus   No results found for: HBA1C, HGBE8, HBA1CPOC, HBA1CEXT poorly controlled       Discussed the pathophysiology, has insulin resistance   She is on appropriate medications, stressed the importance of reducing the carbs, list of protein, non-starchy vegetables and carbs given   Continue Victoza, farxiga   There is no extra benefit of DPP 4 inhibitor in combination with GLP-1 agonist, discontinue Januvia   Continue metformin,   Stressed the importance of checking the blood glucose   Advised to check glucose 2 times daily      Diabetic issues reviewed : glycemic goals , written exchange diet given, low carbohydrate diet, weight control , home glucose monitoring emphasized,  hypoglycemia management and long term diabetic complications discussed.    FLU annually ,Pneumovax ,aspirin daily,annual eye exam,microalbumin      2.  HTN  : Continue current therapy             4.Obesity:Body mass index is 41.81 kg/m??.   Discussed about the importance of exercise and carbohydrate portion control.               There are no Patient Instructions on file  for this visit.         Thank you for allowing me to participate in the care of this patient.      Dennison Mascot, MD         Patient verbalized understanding       Voice-recognition software was used to generate this report, which may result in some phonetic-based errors in the grammar and contents.  Even though attempts  were made to correct all the mistakes, some may have been missed and remained in the body of the report.

## 2018-07-31 NOTE — Progress Notes (Signed)
 Sandy King is a 42 y.o. female here for   Chief Complaint   Patient presents with   . New Patient     referred by Dr. theopolis for DM       Functional glucose monitor and record keeping system? -not checking   Eye exam within last year? - [facility]   Foot exam within last year? - due    1. Have you been to the ER, urgent care clinic since your last visit?  Hospitalized since your last visit? -n/a    2. Have you seen or consulted any other health care providers outside of the [facility] since your last visit?  Include any pap smears or colon screening.-n/a

## 2018-07-31 NOTE — Patient Instructions (Addendum)
victoza in AM 1.8 mg   Farxiga in AM     Glimepiride in AM     After you finish Janumet we will switch to Metformin twice a day .    Check blood sugars before  breakfast and at bedtime.If it is hard check atleast two times a day once fasting and the second time at different times( before dinner, bedtime)     Low blood glucose is less than 70     Goal of blood glucose before meals 90 - 120 , 2  Hours after meals less than 150     Maintain the log and bring it all your appointments    If the bedtime sugars are less than 100 ,eat a 15 gm snack.      Exercising for 30 minutes at least 5 days per week has been shown to increase the lifespan of diabetics. We encourage an active lifestyle that includes regular exercise.     You may benefit from the [facility] at [facility] [phone]     For diet information go to www. [employer]AT[employer].org     Diabetes is the leading cause of blindness in the [place] It is important that you see an eye doctor every year for a dilated retinal exam     Diabetes is the leading cause of amputations in the [place] It is very important that you keep an eye on the condition of you feet. Look for any cuts, calluses, ulcers, fungal infections, rashes, or nail problems.     Diabetics need to be seen several times a year by their physician for  labs and monitoring of their diabetes. Prevention is the key to keeping diabetics out of trouble     Obtain a flu shot each Fall. Aspirin 81 mg is recommended for diabetics older than 40 years .

## 2018-07-31 NOTE — Progress Notes (Signed)
Sandy King is a 41 y.o. female here for   Chief Complaint   Patient presents with   ??? New Patient     referred by Dr. haffizulla for DM       Functional glucose monitor and record keeping system? -not checking   Eye exam within last year? - [facility]   Foot exam within last year? - due    1. Have you been to the ER, urgent care clinic since your last visit?  Hospitalized since your last visit? -n/a    2. Have you seen or consulted any other health care providers outside of the [facility] since your last visit?  Include any pap smears or colon screening.-n/a

## 2018-07-31 NOTE — Progress Notes (Signed)
[facility] CARE DIABETES AND ENDOCRINOLOGY               Dennison Mascot ,MD        [address] [place] [address] [phone] Fax [phone]               Patient Information  Date:07/31/2018  Name : Sandy King 42 y.o.     D.O.B : June 09, 1976         Referred by: Durward Fortes, MD          Chief Complaint   Patient presents with   ??? New Patient     referred by Dr. Redmond School for DM       History of Present Illness: Sandy King is a 42 y.o. female here for initial visit of  Type 2 Diabetes Mellitus.     Type 2 Diabetes was diagnosed 2012 . End organ effects of diabetes: none.    Cardiovascular risk factors: family history   Monitoring frequency: None,  Last A1c was high, has polyuria, no polydipsia  She is on farxiga, Victoza, Janumet  No weight loss with Victoza according to her.  She drinks [place] dry and mostly water.  She is well aware of the importance of the diet and nutrition.  Weight has been fluctuating.  No hypoglycemia.  She had dietitian visit when she had gestational diabetes mellitus  She has started walking  No known pancreatitis, no chest pain, shortness of breath    Wt Readings from Last 3 Encounters:   07/31/18 275 lb (124.7 kg)   10/05/12 288 lb (130.6 kg)       BP Readings from Last 3 Encounters:   07/31/18 138/85   10/05/12 (!) 138/96           Past Medical History:   Diagnosis Date   ??? Depression    ??? Diabetes (HCC)    ??? Headache(784.0)    ??? Hypertension      Current Outpatient Medications   Medication Sig   ??? dapagliflozin (FARXIGA) 5 mg tab tablet Take  by mouth daily.   ??? SITagliptin-metFORMIN (JANUMET XR) 50-1,000 mg TM24 Take 2 Tabs by mouth daily.   ??? lisinopril (PRINIVIL, ZESTRIL) 10 mg tablet Take  by mouth daily.   ??? citalopram (CELEXA) 20 mg tablet Take 20 mg by mouth daily.   ??? amLODIPine (NORVASC) 10 mg tablet Take 10 mg by mouth daily.   ??? Liraglutide (VICTOZA) 0.6 mg/0.1 mL (18 mg/3 mL) sub-q pen 1.8 mg by SubCUTAneous route daily.    ??? metFORMIN (GLUCOPHAGE) 500 mg tablet Take  by mouth two (2) times daily (with meals).   ??? topiramate (TOPAMAX) 50 mg tablet Take  by mouth daily.   ??? ketorolac (TORADOL) 10 mg tablet Take  by mouth. q6h as need up to 5 days.   ??? rizatriptan (MAXALT-MLT) 10 mg disintegrating tablet 1 at HA onset and repeat in 2 hours x 1 prn     No current facility-administered medications for this visit.      Allergies   Allergen Reactions   ??? Codeine Other (comments)     dizziness   ??? Percocet [Oxycodone-Acetaminophen] Other (comments)     dizziness       Review of Systems:  All 10 systems reviewed and are negative other than mentioned in HPI    Physical Examination:   Blood pressure 138/85, pulse 86, temperature 97.8 ??F (36.6 ??C), temperature source Oral, resp. rate 14, height  5\' 8"  (1.727 m), weight 275 lb (124.7 kg), SpO2 100 %. Estimated body mass index is 41.81 kg/m?? as calculated from the following:  -   Height as of this encounter: 5\' 8"  (1.727 m).  -   Weight as of this encounter: 275 lb (124.7 kg).  - General: pleasant, no distress, good eye contact  - HEENT: no pallor, no periorbital edema, EOMI  - Neck: supple, no thyromegaly, no nodules  - Cardiovascular: regular,  normal S1 and S2, no murmurs  - Respiratory: clear to auscultation bilaterally  - Gastrointestinal: soft, nontender, nondistended,  BS +  - Musculoskeletal: no proximal muscle weakness in upper or lower extremities  - Integumentary: No edema  - Neurological: alert and oriented  - Psychiatric: normal mood and affect  - Skin: color, texture, turgor normal.     Diabetic foot exam: August 2019    Left:     Vibratory sensation absent   Filament test decreased sensation with micro filament   Pulse DP: 1+    Deformities: None  Right:    Vibratory sensation absent   Filament test decreased sensation with micro filament   Pulse DP: 1+   Deformities: None          Data Reviewed:       No results found for: HBA1C, HBA1CEXT, HGBE8, GLU, GESTF, GLUCPOC, MCACR,  MCA1, MCA2, MCA3, MCAU, LDL, LDLC, DLDLP, CRES, CREAPOC, ACREA, CREA, REFC3, REFC4, HBA1CEXT   No results found for: GFRNA, GFRNAPOC, GFRAA, GFRAAPOC, CREA, CREAPOC, BUN, IBUN, BUNPOC, NA, NAPOC, K, KPOCT, CL, CLPOC, CO2, CO2POC, MG, PHOS, ALBEU, PTH, PTHILT, EPO    Assessment/Plan:     No diagnosis found.    1. Type 2 Diabetes Mellitus  No results found for: HBA1C, HGBE8, HBA1CPOC, HBA1CEXT poorly controlled     Discussed the pathophysiology, has insulin resistance  She is on appropriate medications, stressed the importance of reducing the carbs, list of protein, non-starchy vegetables and carbs given  Continue Victoza, farxiga  There is no extra benefit of DPP 4 inhibitor in combination with GLP-1 agonist, discontinue Januvia  Continue metformin,  Stressed the importance of checking the blood glucose  Advised to check glucose 2 times daily    Diabetic issues reviewed : glycemic goals , written exchange diet given, low carbohydrate diet, weight control , home glucose monitoring emphasized,  hypoglycemia management and long term diabetic complications discussed.   FLU annually ,Pneumovax ,aspirin daily,annual eye exam,microalbumin    2.  HTN : Continue current therapy         4.Obesity:Body mass index is 41.81 kg/m??.  Discussed about the importance of exercise and carbohydrate portion control.          There are no Patient Instructions on file for this visit.      Thank you for allowing me to participate in the care of this patient.    Dennison MascotUma Axl Rodino, MD      Patient verbalized understanding     Voice-recognition software was used to generate this report, which may result in some phonetic-based errors in the grammar and contents.  Even though attempts were made to correct all the mistakes, some may have been missed and remained in the body of the report.

## 2018-08-01 LAB — MICROALBUMIN / CREATININE URINE RATIO
Creatinine, Ur: 175.7 mg/dL
Microalb, Ur: 82 ug/mL
Microalbumin Creatinine Ratio: 46.7 mg/g creat — ABNORMAL HIGH (ref 0.0–30.0)

## 2018-08-01 LAB — MICROALBUMIN, UR, RAND W/ MICROALB/CREAT RATIO
Creatinine, urine random: 175.7 mg/dL
Microalb/Creat ratio (ug/mg creat.): 46.7 mg/g creat — ABNORMAL HIGH (ref 0.0–30.0)
Microalbumin, urine: 82 ug/mL

## 2018-09-18 ENCOUNTER — Emergency Department (INDEPENDENT_AMBULATORY_CARE_PROVIDER_SITE_OTHER)
Admission: EM | Admit: 2018-09-18 | Discharge: 2018-09-18 | Disposition: A | Discharge status: Home / Self Care | Payer: Medicaid Other | Source: Home / Self Care | Attending: Family Medicine | Admitting: Family Medicine

## 2018-09-18 ENCOUNTER — Encounter: Payer: Self-pay | Admitting: Emergency Medicine

## 2018-09-18 DIAGNOSIS — G43001 Migraine without aura, not intractable, with status migrainosus: Secondary | ICD-10-CM | POA: Diagnosis not present

## 2018-09-18 MED ORDER — METOCLOPRAMIDE HCL 5 MG/ML IJ SOLN
5.0000 mg | Freq: Once | INTRAMUSCULAR | Status: AC
  Administered 2018-09-18: 5 mg via INTRAMUSCULAR

## 2018-09-18 MED ORDER — DEXAMETHASONE SODIUM PHOSPHATE 10 MG/ML IJ SOLN
10.0000 mg | Freq: Once | INTRAMUSCULAR | Status: AC
  Administered 2018-09-18: 10 mg via INTRAMUSCULAR

## 2018-09-18 MED ORDER — KETOROLAC TROMETHAMINE 60 MG/2ML IM SOLN
30.0000 mg | Freq: Once | INTRAMUSCULAR | Status: AC
  Administered 2018-09-18: 30 mg via INTRAMUSCULAR

## 2018-09-18 NOTE — ED Triage Notes (Signed)
Pt c/o HA since 4am. Took excedrin and topamax with no relief.

## 2018-09-18 NOTE — Discharge Instructions (Signed)
°  Please follow up with a primary care provider or headache specialist for recurrent migraines.

## 2018-09-19 NOTE — ED Provider Notes (Signed)
[facility]    CSN: [id number] Arrival date & time: 09/18/18  1938     History   Chief Complaint Chief Complaint  Patient presents with  . Headache    HPI Cynthia Byrd is a 42 y.o. female.   HPI  Cynthia Byrd is a 42 y.o. female presenting to UC with c/o HA c/w prior migraines but is more persistent. Pain is aching and sore, started this morning at 4AM.  She has since taken Excedrin and Topamax but no relief. She had to go to work today but the HA persisted.  Last dose of Excedrin around 5:30PM w/o relief. She attempted to lay down, as sleep typically helps her migraines, but was unable to get comfortable this evening. Her son drove her to [facility] for treatment. Pt has done well with "migraine cocktails" before, including toradol. Denies n/v/d. No fever, chills or congestion.    Past Medical History:  Diagnosis Date  . Diabetes mellitus without complication (HCC)   . Headache   . Hypertension     There are no active problems to display for this patient.   Past Surgical History:  Procedure Laterality Date  . DENTAL SURGERY    . FOOT SURGERY      OB History   None      Home Medications    Prior to Admission medications   Medication Sig Start Date End Date Taking? Authorizing Provider  amLODipine (NORVASC) 5 MG tablet Take 5 mg by mouth daily.    [provider]  aspirin-acetaminophen-caffeine (EXCEDRIN MIGRAINE) 3408341333 MG tablet Take by mouth every 6 (six) hours as needed for headache.    [provider]  citalopram (CELEXA) 10 MG tablet Take 10 mg daily by mouth.    [provider]  glipiZIDE (GLUCOTROL) 10 MG tablet Take 10 mg by mouth 2 (two) times daily before a meal.    [provider]  liraglutide (VICTOZA) 18 MG/3ML SOPN Inject into the skin.    [provider]  oseltamivir (TAMIFLU) 75 MG capsule Take 1 capsule (75 mg total) by mouth every 12 (twelve) hours. 01/15/17   Lurene Shadow, PA-C  Rizatriptan  Benzoate (MAXALT PO) Take by mouth.    [provider]  Topiramate (TOPAMAX PO) Take by mouth.    [provider]    Family History History reviewed. No pertinent family history.  Social History Social History   Tobacco Use  . Smoking status: Never Smoker  . Smokeless tobacco: Never Used  Substance Use Topics  . Alcohol use: Yes    Comment: wine - social  . Drug use: No     Allergies   Codeine and Percocet [oxycodone-acetaminophen]   Review of Systems Review of Systems  Constitutional: Negative for chills and fever.  Eyes: Positive for photophobia. Negative for pain and visual disturbance.  Gastrointestinal: Negative for diarrhea, nausea and vomiting.  Neurological: Positive for headaches. Negative for dizziness, weakness, light-headedness and numbness.     Physical Exam Triage Vital Signs ED Triage Vitals  Enc Vitals Group     BP 09/18/18 2000 (!) 136/92     Pulse Rate 09/18/18 2000 89     Resp --      Temp 09/18/18 2000 97.6 F (36.4 C)     Temp Source 09/18/18 2000 Oral     SpO2 09/18/18 2000 98 %     Weight 09/18/18 2001 265 lb (120.2 kg)     Height --  Head Circumference --      Peak Flow --      Pain Score 09/18/18 2000 9     Pain Loc --      Pain Edu? --      Excl. in GC? --    No data found.  Updated Vital Signs BP (!) 136/92 (BP Location: Right Arm)   Pulse 89   Temp 97.6 F (36.4 C) (Oral)   Wt 265 lb (120.2 kg)   SpO2 98%   BMI 40.89 kg/m   Visual Acuity Right Eye Distance:   Left Eye Distance:   Bilateral Distance:    Right Eye Near:   Left Eye Near:    Bilateral Near:     Physical Exam  Constitutional: She is oriented to person, place, and time. She appears well-developed and well-nourished.  Non-toxic appearance. She does not appear ill. No distress.  HENT:  Head: Normocephalic and atraumatic.  Right Ear: Tympanic membrane normal.  Left Ear: Tympanic membrane normal.  Nose: Nose normal.  Mouth/Throat:  Uvula is midline, oropharynx is clear and moist and mucous membranes are normal.  Eyes: Pupils are equal, round, and reactive to light. EOM are normal.  Neck: Normal range of motion. Neck supple.  Cardiovascular: Normal rate and regular rhythm.  Pulmonary/Chest: Effort normal and breath sounds normal.  Musculoskeletal: Normal range of motion.  Neurological: She is alert and oriented to person, place, and time. She has normal strength. She displays a negative Romberg sign. Coordination and gait normal. GCS eye subscore is 4. GCS verbal subscore is 5. GCS motor subscore is 6.  Skin: Skin is warm and dry.  Psychiatric: She has a normal mood and affect. Her behavior is normal.  Nursing note and vitals reviewed.    UC Treatments / Results  Labs (all labs ordered are listed, but only abnormal results are displayed) Labs Reviewed - No data to display  EKG None  Radiology No results found.  Procedures Procedures (including critical care time)  Medications Ordered in UC Medications  ketorolac (TORADOL) injection 30 mg (30 mg Intramuscular Given 09/18/18 2015)  dexamethasone (DECADRON) injection 10 mg (10 mg Intramuscular Given 09/18/18 2015)  metoCLOPramide (REGLAN) injection 5 mg (5 mg Intramuscular Given 09/18/18 2015)    Initial Impression / Assessment and Plan / UC Course  I have reviewed the triage vital signs and the nursing notes.  Pertinent labs & imaging results that were available during my care of the patient were reviewed by me and considered in my medical decision making (see chart for details).    PMH reviewed, hx of same.  Hx and exam c/w migraine.  No evidence of emergent process such as a CVA or SAH taking place at this time.  Migraine cocktail given, some relief by time of discharge.  Final Clinical Impressions(s) / UC Diagnoses   Final diagnoses:  Migraine without aura and with status migrainosus, not intractable     Discharge Instructions      Please  follow up with a primary care provider or headache specialist for recurrent migraines.     ED Prescriptions    None     Controlled Substance Prescriptions [place] Controlled Substance Registry consulted? Not Applicable   Rolla Plate 09/19/18 4259

## 2018-11-10 ENCOUNTER — Emergency Department (HOSPITAL_BASED_OUTPATIENT_CLINIC_OR_DEPARTMENT_OTHER): Payer: Medicaid Other

## 2018-11-10 ENCOUNTER — Encounter (HOSPITAL_BASED_OUTPATIENT_CLINIC_OR_DEPARTMENT_OTHER): Payer: Self-pay

## 2018-11-10 ENCOUNTER — Other Ambulatory Visit: Payer: Self-pay

## 2018-11-10 ENCOUNTER — Emergency Department (HOSPITAL_BASED_OUTPATIENT_CLINIC_OR_DEPARTMENT_OTHER)
Admission: EM | Admit: 2018-11-10 | Discharge: 2018-11-10 | Disposition: A | Discharge status: Home / Self Care | Payer: Medicaid Other | Attending: Emergency Medicine | Admitting: Emergency Medicine

## 2018-11-10 DIAGNOSIS — Z79899 Other long term (current) drug therapy: Secondary | ICD-10-CM | POA: Insufficient documentation

## 2018-11-10 DIAGNOSIS — E119 Type 2 diabetes mellitus without complications: Secondary | ICD-10-CM | POA: Insufficient documentation

## 2018-11-10 DIAGNOSIS — Z7984 Long term (current) use of oral hypoglycemic drugs: Secondary | ICD-10-CM | POA: Diagnosis not present

## 2018-11-10 DIAGNOSIS — J40 Bronchitis, not specified as acute or chronic: Secondary | ICD-10-CM

## 2018-11-10 DIAGNOSIS — R05 Cough: Secondary | ICD-10-CM | POA: Diagnosis present

## 2018-11-10 DIAGNOSIS — I10 Essential (primary) hypertension: Secondary | ICD-10-CM | POA: Diagnosis not present

## 2018-11-10 MED ORDER — ALBUTEROL SULFATE HFA 108 (90 BASE) MCG/ACT IN AERS
1.0000 | INHALATION_SPRAY | Freq: Once | RESPIRATORY_TRACT | Status: AC
  Administered 2018-11-10: 1 via RESPIRATORY_TRACT
  Filled 2018-11-10: qty 6.7

## 2018-11-10 MED ORDER — BENZONATATE 100 MG PO CAPS: 100.0000 mg | ORAL_CAPSULE | Freq: Three times a day (TID) | ORAL | 0 refills | Status: DC | PRN

## 2018-11-10 MED ORDER — AEROCHAMBER PLUS FLO-VU MEDIUM MISC
1.0000 | Freq: Once | Status: AC
  Administered 2018-11-10: 1
  Filled 2018-11-10: qty 1

## 2018-11-10 MED ORDER — GUAIFENESIN ER 600 MG PO TB12: 600.0000 mg | ORAL_TABLET | Freq: Two times a day (BID) | ORAL | 0 refills | Status: DC

## 2018-11-10 NOTE — Discharge Instructions (Signed)
Please read and follow all provided instructions.  Your diagnoses today include:  1. Bronchitis   2. Elevated blood pressure reading in office with diagnosis of hypertension     You appear to have an upper respiratory infection (URI). An upper respiratory tract infection, or cold, is a viral infection of the air passages leading to the lungs. It should improve gradually after 5-7 days. You may have a lingering cough that lasts for 2- 4 weeks after the infection.  Tests performed today include: Vital signs. See below for your results today.   Medications prescribed:   Take any prescribed medications only as directed. Treatment for your infection is aimed at treating the symptoms. There are no medications, such as antibiotics, that will cure your infection.   You may take [employer] Perles, 3 times a day as needed for coughing.  This medication is toxic to children overdose, so please make sure it is away from children.  You may take Mucinex twice a day.  Please use the inhaler every 4-6 hours as needed for cough and shortness of breath.  Home care instructions:  Follow any educational materials contained in this packet.   Your illness is contagious and can be spread to others, especially during the first 3 or 4 days. It cannot be cured by antibiotics or other medicines. Take basic precautions such as washing your hands often, covering your mouth when you cough or sneeze, and avoiding public places where you could spread your illness to others.   Please continue drinking plenty of fluids.  Use over-the-counter medicines as needed as directed on packaging for symptom relief.  You may also use ibuprofen or tylenol as directed on packaging for pain or fever.  Do not take multiple medicines containing Tylenol or acetaminophen to avoid taking too much of this medication.  Follow-up instructions: Please follow-up with your primary care provider in the next 3 days for further evaluation of your  symptoms if you are not feeling better.   Return instructions:  Please return to the Emergency Department if you experience worsening symptoms.  RETURN IMMEDIATELY IF you develop shortness of breath, chest pain, confusion or altered mental status, a new rash, become dizzy, faint, or poorly responsive, or are unable to be cared for at home. Please return if you have persistent vomiting and cannot keep down fluids or develop a fever that is not controlled by tylenol or motrin.   Please return if you have any other emergent concerns.  Additional Information:  Your vital signs today were: BP (!) 165/105 (BP Location: Left Arm)    Pulse 83    Temp 99 F (37.2 C) (Oral)    Resp 18    Ht 5' 7.5" (1.715 m)    Wt 120.2 kg    LMP 11/07/2018    SpO2 100%    BMI 40.89 kg/m  If your blood pressure (BP) was elevated above 135/85 this visit, please have this repeated by your doctor within one month. --------------

## 2018-11-10 NOTE — ED Provider Notes (Signed)
[facility] EMERGENCY DEPARTMENT Provider Note   CSN: [id number] Arrival date & time: 11/10/18  1602     History   Chief Complaint Chief Complaint  Patient presents with  . Cough    HPI Cynthia Byrd is a 42 y.o. female.  HPI  Patient is a 42 year old female with a history of type 2 diabetes mellitus, hypertension, and headaches presenting for cough.  She reports that she has had a productive cough for approximately 4 days.  She reports recent sick contacts at work.  Patient denies any hemoptysis.  Patient denies any fevers or chills.  Patient denies any congestion, rhinorrhea, otalgia.  Patient does report she has a sore throat when coughing, but pain in her throat is not interfering with breathing or swallowing.  Patient reports taking tea infused with cough drops without relief.  Patient reports she is concerned about bronchitis today.  Patient has remote history of using inhalers with respiratory infections, but no history of primary lung disease.  Patient is a never smoker.  Patient does not vape.  Past Medical History:  Diagnosis Date  . Diabetes mellitus without complication (HCC)   . Headache   . Hypertension     There are no active problems to display for this patient.   Past Surgical History:  Procedure Laterality Date  . DENTAL SURGERY    . FOOT SURGERY       OB History   None      Home Medications    Prior to Admission medications   Medication Sig Start Date End Date Taking? Authorizing Provider  amLODipine (NORVASC) 5 MG tablet Take 5 mg by mouth daily.    [provider]  aspirin-acetaminophen-caffeine (EXCEDRIN MIGRAINE) [id number] MG tablet Take by mouth every 6 (six) hours as needed for headache.    [provider]  citalopram (CELEXA) 10 MG tablet Take 10 mg daily by mouth.    [provider]  glipiZIDE (GLUCOTROL) 10 MG tablet Take 10 mg by mouth 2 (two) times daily before a meal.    [provider]    liraglutide (VICTOZA) 18 MG/3ML SOPN Inject into the skin.    [provider]  oseltamivir (TAMIFLU) 75 MG capsule Take 1 capsule (75 mg total) by mouth every 12 (twelve) hours. 01/15/17   Lurene Shadow, PA-C  Rizatriptan Benzoate (MAXALT PO) Take by mouth.    [provider]  Topiramate (TOPAMAX PO) Take by mouth.    [provider]    Family History No family history on file.  Social History Social History   Tobacco Use  . Smoking status: Never Smoker  . Smokeless tobacco: Never Used  Substance Use Topics  . Alcohol use: Yes    Comment: wine - social  . Drug use: No     Allergies   Codeine and Percocet [oxycodone-acetaminophen]   Review of Systems Review of Systems  Constitutional: Negative for chills and fever.  HENT: Positive for sore throat. Negative for congestion and rhinorrhea.   Respiratory: Positive for cough. Negative for shortness of breath and wheezing.   Cardiovascular: Negative for chest pain.     Physical Exam Updated Vital Signs BP (!) 165/105 (BP Location: Left Arm)   Pulse 83   Temp 99 F (37.2 C) (Oral)   Resp 18   Ht 5' 7.5" (1.715 m)   Wt 120.2 kg   LMP 11/07/2018   SpO2 100%   BMI 40.89 kg/m   Physical Exam  Constitutional: She  appears well-developed and well-nourished. No distress.  Sitting comfortably in bed.  HENT:  Head: Normocephalic and atraumatic.  Bilateral tympanic membranes without evidence of erythema or effusion. Cobblestoning posterior pharynx, mild erythema.  No exudate of posterior pharynx.  Eyes: Conjunctivae are normal. Right eye exhibits no discharge. Left eye exhibits no discharge.  EOMs normal to gross examination.  Neck: Normal range of motion.  Cardiovascular: Normal rate and regular rhythm.  Intact, 2+ radial pulse.  Pulmonary/Chest: Effort normal and breath sounds normal. No respiratory distress. She has no wheezes. She has no rales.  Normal respiratory effort. Patient converses  comfortably. No audible wheeze or stridor.  Cough present on exam.  Deep, dry sounding cough.  Abdominal: She exhibits no distension.  Musculoskeletal: Normal range of motion.  Neurological: She is alert.  Cranial nerves intact to gross observation. Patient moves extremities without difficulty.  Skin: Skin is warm and dry. She is not diaphoretic.  Psychiatric: She has a normal mood and affect. Her behavior is normal. Judgment and thought content normal.  Nursing note and vitals reviewed.    ED Treatments / Results  Labs (all labs ordered are listed, but only abnormal results are displayed) Labs Reviewed - No data to display  EKG None  Radiology Dg Chest 2 View  Result Date: 11/10/2018 CLINICAL DATA:  Cough, 4 days duration EXAM: CHEST - 2 VIEW COMPARISON:  None. FINDINGS: Mild central bronchial thickening. No infiltrate, collapse or effusion. Heart and mediastinal shadows are normal. No bone abnormality. IMPRESSION: Mild bronchitis pattern.  No consolidation or collapse. Electronically Signed   By: Paulina FusiMark  Shogry M.D.   On: 11/10/2018 17:36    Procedures Procedures (including critical care time)  Medications Ordered in ED Medications  albuterol (PROVENTIL HFA;VENTOLIN HFA) 108 (90 Base) MCG/ACT inhaler 1 puff (1 puff Inhalation Given 11/10/18 1710)  AEROCHAMBER PLUS FLO-VU MEDIUM MISC 1 each (1 each Other Given 11/10/18 1710)     Initial Impression / Assessment and Plan / ED Course  I have reviewed the triage vital signs and the nursing notes.  Pertinent labs & imaging results that were available during my care of the patient were reviewed by me and considered in my medical decision making (see chart for details).  Clinical Course as of Nov 10 1738  Fri Nov 10, 2018  1738 Patient did not take BP meds today.   BP(!): 165/105 [AM]    Clinical Course User Index [AM] Elisha PonderMurray, Joani Cosma B, PA-C    Patient with symptoms consistent with a viral syndrome.  Suspect viral bronchitis.   Chest x-ray, read by me without evidence of infiltrate.  Patient does have a bronchitic inflammation pattern.  Vitals are stable, no fever. No signs of dehydration. Lung exam normal, no signs of pneumonia. Supportive therapy indicated with return if symptoms worsen.    Recommend Tessalon Perles as well as Mucinex.  Recommended refraining from dextran with morphine containing products due to hypertension.  Hypertension noted today.  Patient has not taken her antihypertensives today.  Final Clinical Impressions(s) / ED Diagnoses   Final diagnoses:  Bronchitis  Elevated blood pressure reading in office with diagnosis of hypertension    ED Discharge Orders         Ordered    guaiFENesin (MUCINEX) 600 MG 12 hr tablet  2 times daily     11/10/18 1747    benzonatate (TESSALON PERLES) 100 MG capsule  3 times daily PRN     11/10/18 1747  Elisha Ponder, PA-C 11/10/18 1749    Derwood Kaplan, MD 11/11/18 [phone]

## 2018-11-10 NOTE — ED Triage Notes (Signed)
C/o flu like sx x 2 days-NAD-steady gait 

## 2018-11-13 ENCOUNTER — Emergency Department (HOSPITAL_BASED_OUTPATIENT_CLINIC_OR_DEPARTMENT_OTHER)
Admission: EM | Admit: 2018-11-13 | Discharge: 2018-11-13 | Disposition: A | Discharge status: Home / Self Care | Payer: Medicaid Other | Attending: Emergency Medicine | Admitting: Emergency Medicine

## 2018-11-13 ENCOUNTER — Encounter (HOSPITAL_BASED_OUTPATIENT_CLINIC_OR_DEPARTMENT_OTHER): Payer: Self-pay

## 2018-11-13 ENCOUNTER — Other Ambulatory Visit: Payer: Self-pay

## 2018-11-13 DIAGNOSIS — Z0279 Encounter for issue of other medical certificate: Secondary | ICD-10-CM | POA: Diagnosis not present

## 2018-11-13 DIAGNOSIS — Z5321 Procedure and treatment not carried out due to patient leaving prior to being seen by health care provider: Secondary | ICD-10-CM | POA: Diagnosis not present

## 2018-12-08 LAB — AMB EXT CREATININE
Creatinine, External: 0.7
Creatinine, External: 0.7 NA

## 2018-12-08 LAB — AMB EXT HGBA1C
Hemoglobin A1C, External: 12.3 NA
Hemoglobin A1c, External: 12.3

## 2019-01-26 ENCOUNTER — Encounter: Attending: Family Medicine | Primary: Family Medicine

## 2019-02-23 ENCOUNTER — Encounter: Attending: Family Medicine | Primary: Family Medicine

## 2019-03-06 ENCOUNTER — Encounter
Admit: 2019-03-06 | Discharge: 2019-03-06 | Payer: PRIVATE HEALTH INSURANCE | Attending: Family Medicine | Primary: Family Medicine

## 2019-07-05 ENCOUNTER — Encounter (HOSPITAL_BASED_OUTPATIENT_CLINIC_OR_DEPARTMENT_OTHER): Payer: Self-pay | Admitting: Emergency Medicine

## 2019-07-05 ENCOUNTER — Other Ambulatory Visit: Payer: Self-pay

## 2019-07-05 ENCOUNTER — Emergency Department (HOSPITAL_BASED_OUTPATIENT_CLINIC_OR_DEPARTMENT_OTHER)
Admission: EM | Admit: 2019-07-05 | Discharge: 2019-07-05 | Disposition: A | Discharge status: Home / Self Care | Payer: Medicaid Other | Attending: Emergency Medicine | Admitting: Emergency Medicine

## 2019-07-05 DIAGNOSIS — I10 Essential (primary) hypertension: Secondary | ICD-10-CM | POA: Insufficient documentation

## 2019-07-05 DIAGNOSIS — Z79899 Other long term (current) drug therapy: Secondary | ICD-10-CM | POA: Insufficient documentation

## 2019-07-05 DIAGNOSIS — Z7984 Long term (current) use of oral hypoglycemic drugs: Secondary | ICD-10-CM | POA: Insufficient documentation

## 2019-07-05 DIAGNOSIS — R42 Dizziness and giddiness: Secondary | ICD-10-CM | POA: Insufficient documentation

## 2019-07-05 DIAGNOSIS — E119 Type 2 diabetes mellitus without complications: Secondary | ICD-10-CM | POA: Insufficient documentation

## 2019-07-05 LAB — CBC
HCT: 40.7 % (ref 36.0–46.0)
Hemoglobin: 12.7 g/dL (ref 12.0–15.0)
MCH: 25 pg — ABNORMAL LOW (ref 26.0–34.0)
MCHC: 31.2 g/dL (ref 30.0–36.0)
MCV: 80 fL (ref 80.0–100.0)
Platelets: 267 10*3/uL (ref 150–400)
RBC: 5.09 MIL/uL (ref 3.87–5.11)
RDW: 14.6 % (ref 11.5–15.5)
WBC: 7.2 10*3/uL (ref 4.0–10.5)
nRBC: 0 % (ref 0.0–0.2)

## 2019-07-05 LAB — BASIC METABOLIC PANEL
Anion gap: 12 (ref 5–15)
BUN: 11 mg/dL (ref 6–20)
CO2: 24 mmol/L (ref 22–32)
Calcium: 9.3 mg/dL (ref 8.9–10.3)
Chloride: 100 mmol/L (ref 98–111)
Creatinine, Ser: 0.78 mg/dL (ref 0.44–1.00)
GFR calc Af Amer: >60 mL/min (ref >60)
GFR calc non Af Amer: >60 mL/min (ref >60)
Glucose, Bld: 125 mg/dL — ABNORMAL HIGH (ref 70–99)
Potassium: 3.9 mmol/L (ref 3.5–5.1)
Sodium: 136 mmol/L (ref 135–145)

## 2019-07-05 LAB — CBG MONITORING, ED: Glucose-Capillary: 114 mg/dL — ABNORMAL HIGH (ref 70–99)

## 2019-07-05 LAB — PREGNANCY, URINE: Preg Test, Ur: NEGATIVE

## 2019-07-05 MED ORDER — SODIUM CHLORIDE 0.9 % IV BOLUS
1000.0000 mL | Freq: Once | INTRAVENOUS | Status: AC
  Administered 2019-07-05: 1000 mL via INTRAVENOUS

## 2019-07-05 NOTE — ED Notes (Signed)
ED Provider at bedside. 

## 2019-07-05 NOTE — ED Notes (Signed)
Pt verbalized understanding of dc instructions.

## 2019-07-05 NOTE — Discharge Instructions (Signed)
If you develop severe headache, vomiting, blurry vision, chest pain, feeling like you are going to pass out, inability to walk, weakness or numbness, or any other new/concerning symptoms then return to the ER for evaluation.

## 2019-07-05 NOTE — ED Provider Notes (Signed)
[facility] EMERGENCY DEPARTMENT Provider Note   CSN: [id number] Arrival date & time: 07/05/19  1028    History   Chief Complaint Chief Complaint  Patient presents with  . Dizziness    HPI Cynthia Byrd is a 43 y.o. female.     HPI  43 year old female presents with acute dizziness.  Started around 9:00.  He woke up feeling fine and was on her way to work.  Going down the stairs she felt a little bit off and this worsened when she was in the car which was in the outside heat. Some nausea but no vomiting.  No blurry vision, chest pain.  Does not feel like the room is spinning or she is off balance.  However just feels like she was not good enough to drive so she came here instead.  Past Medical History:  Diagnosis Date  . Diabetes mellitus without complication (HCC)   . Headache   . Hypertension     There are no active problems to display for this patient.   Past Surgical History:  Procedure Laterality Date  . DENTAL SURGERY    . FOOT SURGERY       OB History   No obstetric history on file.      Home Medications    Prior to Admission medications   Medication Sig Start Date End Date Taking? Authorizing Provider  amLODipine (NORVASC) 5 MG tablet Take 5 mg by mouth daily.   Yes [provider]  metFORMIN (GLUCOPHAGE) 1000 MG tablet Take 1,000 mg by mouth 2 (two) times daily with a meal.   Yes [provider]  aspirin-acetaminophen-caffeine (EXCEDRIN MIGRAINE) 250-250-65 MG tablet Take by mouth every 6 (six) hours as needed for headache.    [provider]  benzonatate (TESSALON PERLES) 100 MG capsule Take 1 capsule (100 mg total) by mouth 3 (three) times daily as needed for cough. 11/10/18   Aviva KluverMurray, Alyssa B, PA-C  citalopram (CELEXA) 10 MG tablet Take 10 mg daily by mouth.    [provider]  glipiZIDE (GLUCOTROL) 10 MG tablet Take 10 mg by mouth 2 (two) times daily before a meal.    [provider]  guaiFENesin  (MUCINEX) 600 MG 12 hr tablet Take 1 tablet (600 mg total) by mouth 2 (two) times daily. 11/10/18   Aviva KluverMurray, Alyssa B, PA-C  liraglutide (VICTOZA) 18 MG/3ML SOPN Inject into the skin.    [provider]  oseltamivir (TAMIFLU) 75 MG capsule Take 1 capsule (75 mg total) by mouth every 12 (twelve) hours. 01/15/17   Lurene ShadowPhelps, Erin O, PA-C  Rizatriptan Benzoate (MAXALT PO) Take by mouth.    [provider]  Topiramate (TOPAMAX PO) Take by mouth.    [provider]    Family History No family history on file.  Social History Social History   Tobacco Use  . Smoking status: Never Smoker  . Smokeless tobacco: Never Used  Substance Use Topics  . Alcohol use: Yes    Comment: occ  . Drug use: No     Allergies   Codeine and Percocet [oxycodone-acetaminophen]   Review of Systems Review of Systems  Constitutional: Negative for fever.  Eyes: Negative for visual disturbance.  Respiratory: Negative for shortness of breath.   Cardiovascular: Negative for chest pain.  Gastrointestinal: Negative for diarrhea and vomiting.  Musculoskeletal: Negative for neck pain.  Neurological: Positive for dizziness. Negative for weakness, numbness and headaches.  All other systems reviewed and are negative.  Physical Exam Updated Vital Signs BP 138/79 (BP Location: Right Arm)   Pulse 87   Temp 98.8 F (37.1 C) (Oral)   Resp 18   Ht 5\' 7"  (1.702 m)   Wt 123.4 kg   SpO2 100%   BMI 42.60 kg/m   Physical Exam Vitals signs and nursing note reviewed.  Constitutional:      General: She is not in acute distress.    Appearance: She is well-developed. She is not ill-appearing or diaphoretic.  HENT:     Head: Normocephalic and atraumatic.     Right Ear: External ear normal.     Left Ear: External ear normal.     Nose: Nose normal.  Eyes:     General:        Right eye: No discharge.        Left eye: No discharge.     Extraocular Movements: Extraocular movements intact.      Right eye: No nystagmus.     Left eye: No nystagmus.     Pupils: Pupils are equal, round, and reactive to light.  Cardiovascular:     Rate and Rhythm: Normal rate and regular rhythm.     Heart sounds: Normal heart sounds.  Pulmonary:     Effort: Pulmonary effort is normal.     Breath sounds: Normal breath sounds.  Abdominal:     Palpations: Abdomen is soft.     Tenderness: There is no abdominal tenderness.  Skin:    General: Skin is warm and dry.  Neurological:     Mental Status: She is alert.     Comments: CN 3-12 grossly intact. 5/5 strength in all 4 extremities. Grossly normal sensation. Normal finger to nose. Normal gait.  Psychiatric:        Mood and Affect: Mood is not anxious.      ED Treatments / Results  Labs (all labs ordered are listed, but only abnormal results are displayed) Labs Reviewed  BASIC METABOLIC PANEL - Abnormal; Notable for the following components:      Result Value   Glucose, Bld 125 (*)    All other components within normal limits  CBC - Abnormal; Notable for the following components:   MCH 25.0 (*)    All other components within normal limits  CBG MONITORING, ED - Abnormal; Notable for the following components:   Glucose-Capillary 114 (*)    All other components within normal limits  PREGNANCY, URINE    EKG EKG Interpretation  Date/Time:  Thursday July 05 2019 10:58:56 EDT Ventricular Rate:  86 PR Interval:    QRS Duration: 76 QT Interval:  372 QTC Calculation: 445 R Axis:   32 Text Interpretation:  Sinus rhythm Baseline wander in lead(s) III no acute ST/T changes No old tracing to compare Confirmed by Pricilla LovelessGoldston, Berdell Hostetler [phone] on 07/05/2019 11:02:46 AM Also confirmed by Pricilla LovelessGoldston, Lucille Crichlow [phone]), editor Barbette Hairassel, Kerry [phone])  on 07/05/2019 12:00:06 PM   Radiology No results found.  Procedures Procedures (including critical care time)  Medications Ordered in ED Medications  sodium chloride 0.9 % bolus 1,000 mL (0 mLs Intravenous  Stopped 07/05/19 1210)     Initial Impression / Assessment and Plan / ED Course  I have reviewed the triage vital signs and the nursing notes.  Pertinent labs & imaging results that were available during my care of the patient were reviewed by me and considered in my medical decision making (see chart for details).        Unclear  cause of the mild dizziness but it does not appear to be impairing her walking and does not really seem like a near syncope.  Labs are reassuring.  ECG is benign.  My suspicion for CNS emergency such as stroke or mass is very low.  Doubt arrhythmia.  Feels little better after fluids.  Discharged home with return precautions.  Final Clinical Impressions(s) / ED Diagnoses   Final diagnoses:  Dizziness    ED Discharge Orders    None       Sherwood Gambler, MD 07/05/19 [phone]

## 2019-07-05 NOTE — ED Triage Notes (Signed)
Pt c/o sudden onset of dizziness and nausea at 9:20 this morning while in the car. Denies pain, SOB, extremity weakness. Speech is clear.

## 2019-07-17 ENCOUNTER — Encounter

## 2019-07-17 NOTE — Assessment & Plan Note (Signed)
Key Antihyperglycemic Medications             dapagliflozin (FARXIGA) 5 mg tab tablet Take  by mouth daily.    glimepiride (AMARYL) 4 mg tablet Take 1 Tab by mouth every morning.    metFORMIN ER (GLUCOPHAGE XR) 750 mg tablet Take 1 Tab by mouth two (2) times a day. Stop janumet    Liraglutide (VICTOZA) 0.6 mg/0.1 mL (18 mg/3 mL) sub-q pen 1.8 mg by SubCUTAneous route daily.        Other Key Diabetic Medications             lisinopril (PRINIVIL, ZESTRIL) 10 mg tablet Take  by mouth daily.    topiramate (TOPAMAX) 50 mg tablet Take  by mouth daily.        Lab Results   Component Value Date/Time    Hemoglobin A1c, External 12.3 12/08/2018    Creatinine, External 0.70 12/08/2018    Microalb/Creat ratio (ug/mg creat.) 46.7 07/31/2018 09:53 AM     Diabetic Foot and Eye Exam HM Status   Topic Date Due   . Eye Exam  08/01/1986   . Diabetic Foot Care  08/01/2019

## 2019-07-17 NOTE — Assessment & Plan Note (Signed)
Key Antihyperglycemic Medications             dapagliflozin (FARXIGA) 5 mg tab tablet Take  by mouth daily.    glimepiride (AMARYL) 4 mg tablet Take 1 Tab by mouth every morning.    metFORMIN ER (GLUCOPHAGE XR) 750 mg tablet Take 1 Tab by mouth two (2) times a day. Stop janumet    Liraglutide (VICTOZA) 0.6 mg/0.1 mL (18 mg/3 mL) sub-q pen 1.8 mg by SubCUTAneous route daily.        Other Key Diabetic Medications             lisinopril (PRINIVIL, ZESTRIL) 10 mg tablet Take  by mouth daily.    topiramate (TOPAMAX) 50 mg tablet Take  by mouth daily.        Lab Results   Component Value Date/Time    Hemoglobin A1c, External 12.3 12/08/2018    Creatinine, External 0.70 12/08/2018    Microalb/Creat ratio (ug/mg creat.) 46.7 07/31/2018 09:53 AM     Diabetic Foot and Eye Exam HM Status   Topic Date Due   ??? Eye Exam  08/01/1986   ??? Diabetic Foot Care  08/01/2019

## 2019-08-20 NOTE — Telephone Encounter (Signed)
Pt is asking to speak directly to Dr. Rexene Edison and I explain to patient the usually in a doctor office, the nurse would call to dicuss what is going on. Pt wanted to discuss with doctor H about being put on a new medication and I explain to pt that Dr. Rexene Edison is going to require an appt. Pt transferred to the front to schedule appt

## 2019-08-20 NOTE — Telephone Encounter (Signed)
Pt asked for you to call her asap [phone]

## 2019-09-12 ENCOUNTER — Encounter: Payer: BLUE CROSS/BLUE SHIELD | Attending: Family Medicine | Primary: Family Medicine

## 2019-10-17 ENCOUNTER — Telehealth
Admit: 2019-10-17 | Discharge: 2019-10-17 | Payer: BLUE CROSS/BLUE SHIELD | Attending: Family Medicine | Primary: Family Medicine

## 2019-10-17 ENCOUNTER — Telehealth: Attending: Family Medicine | Primary: Family Medicine

## 2019-10-17 DIAGNOSIS — E119 Type 2 diabetes mellitus without complications: Secondary | ICD-10-CM

## 2019-10-17 MED ORDER — CITALOPRAM 40 MG TAB
40 mg | ORAL_TABLET | ORAL | 1 refills | Status: DC
Start: 2019-10-17 — End: 2020-04-23

## 2019-10-17 MED ORDER — AMLODIPINE 10 MG TAB
10 mg | ORAL_TABLET | Freq: Every day | ORAL | 1 refills | Status: DC
Start: 2019-10-17 — End: 2020-04-23

## 2019-10-17 MED ORDER — LISINOPRIL 10 MG TAB
10 mg | ORAL_TABLET | Freq: Every day | ORAL | 1 refills | Status: DC
Start: 2019-10-17 — End: 2020-04-23

## 2019-10-17 MED ORDER — DAPAGLIFLOZIN 5 MG TABLET
5 mg | ORAL_TABLET | Freq: Every day | ORAL | 1 refills | Status: DC
Start: 2019-10-17 — End: 2020-04-23

## 2019-10-17 MED ORDER — KETOROLAC TROMETHAMINE 10 MG TAB
10 mg | ORAL_TABLET | Freq: Four times a day (QID) | ORAL | 1 refills | Status: AC | PRN
Start: 2019-10-17 — End: 2019-10-22

## 2019-10-17 MED ORDER — ROSUVASTATIN 5 MG TAB
5 mg | ORAL_TABLET | Freq: Every evening | ORAL | 1 refills | Status: DC
Start: 2019-10-17 — End: 2020-04-23

## 2019-10-17 MED ORDER — METFORMIN SR 500 MG 24 HR TABLET
500 mg | ORAL_TABLET | Freq: Two times a day (BID) | ORAL | 1 refills | Status: DC
Start: 2019-10-17 — End: 2020-04-23

## 2019-10-17 MED ORDER — LIRAGLUTIDE 0.6 MG/0.1 ML (18 MG/3 ML) SUB-Q PEN INJECTOR
0.6 mg/0.1 mL (18 mg/3 mL) | Freq: Every day | SUBCUTANEOUS | 1 refills | Status: DC
Start: 2019-10-17 — End: 2020-01-02

## 2019-10-17 NOTE — Progress Notes (Signed)
HISTORY OF PRESENT ILLNESS  THIS IS  VIDEO VISIT PERFORMED THRU THE SECURE WEBSITE DOXY. ME.     Diabetes:  DM follow up appt  FBS 110- 120  occ up to 180s.   PM #s [phone].   When  off metformin was up about 40 pts.    Last  Eye exam:2019, negative.  Has astigmatism but no retinopathy.   PT is taking farziga 5, metformin 500 ER  BID, victoza.    There is no medication concerns.  Denies any neurological symptoms , and cuts heal well.  Checks feet at home.      Hypertension:  PT taking lisinopril 10, amlodipine 10.      Checks BP at home BP 120s/80s      Hyperlipidemia:    On rosuvastatin 5 mg.  treating well, no  Joint  Or body aches.        Anxiety : On SSRI - celexa 20 mg.    with no s/e.  Mood is controlled and not labile.  Resting well.     working from home and anxiety has improved.   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     Lives in [place] permanently now.    Visit parents here often.     Migraines have improved and more manageable.   Less often.  .      Sandy King is a 43 y.o. female.  Past Medical History:   Diagnosis Date   ??? Acne vulgaris    ??? Anxiety    ??? Asthma    ??? Chlamydia    ??? Chondromalacia patellae of right knee    ??? Depression    ??? Diabetes (HCC)    ??? Headache(784.0)    ??? Hypertension    ??? Keloid of skin    ??? Migraines      Social History     Tobacco Use   ??? Smoking status: Never Smoker   ??? Smokeless tobacco: Never Used   Substance Use Topics   ??? Alcohol use: Yes     Comment: social   ??? Drug use: No       Family History   Problem Relation Age of Onset   ??? Breast Cancer Mother    ??? Diabetes Mother    ??? Hypertension Mother    ??? Cancer Mother         breast       Review of Systems   Constitutional: Positive for malaise/fatigue. Negative for chills, diaphoresis, fever and weight loss.   HENT: Negative for congestion.    Eyes: Negative for blurred vision.   Respiratory: Negative for cough and shortness of breath.    Cardiovascular: Negative for chest pain, palpitations, orthopnea and leg swelling.    Gastrointestinal: Negative for abdominal pain, diarrhea, heartburn and nausea.   Musculoskeletal: Negative for myalgias.   Skin: Positive for itching. Negative for rash.   Neurological: Negative for dizziness, tremors, weakness and headaches.   Psychiatric/Behavioral: Negative for depression. The patient is not nervous/anxious and does not have insomnia.             Physical Exam  Nursing note reviewed.   Constitutional:       Appearance: Normal appearance. She is obese.   HENT:      Head: Normocephalic and atraumatic.   Eyes:      Extraocular Movements: Extraocular movements intact.      Conjunctiva/sclera: Conjunctivae normal.   Pulmonary:      Effort: Pulmonary  effort is normal. No respiratory distress.   Abdominal:      Palpations: Abdomen is soft.   Neurological:      General: No focal deficit present.      Mental Status: She is alert and oriented to person, place, and time.   Psychiatric:         Mood and Affect: Mood normal.         Behavior: Behavior normal.         Thought Content: Thought content normal.         Judgment: Judgment normal.           ASSESSMENT and PLAN  Diagnoses and all orders for this visit:    1. Type 2 diabetes mellitus without complication, unspecified whether long term insulin use ([place])  Comments:  check labs.   will mail to pt.  FBS goal 80- 130  Orders:  -     dapagliflozin (Farxiga) 5 mg tab tablet; Take 1 Tab by mouth daily.  -     metFORMIN ER (GLUCOPHAGE XR) 500 mg tablet; Take 1 Tab by mouth two (2) times a day.  -     liraglutide (VICTOZA) 0.6 mg/0.1 mL (18 mg/3 mL) pnij; 1.8 mg by SubCUTAneous route daily.    2. Essential (primary) hypertension  Comments:  sounds controlled  Orders:  -     amLODIPine (NORVASC) 10 mg tablet; Take 1 Tab by mouth daily.  -     lisinopriL (PRINIVIL, ZESTRIL) 10 mg tablet; Take 1 Tab by mouth daily.    3. Generalized anxiety disorder  Comments:  improved on celexa and stable  Orders:  -     citalopram (CELEXA) 40 mg tablet; TAKE 1 TABLET BY  MOUTH ONCE DAILY FOR 90 DAYS    4. Mixed hyperlipidemia  Comments:  check labs.   Orders:  -     rosuvastatin (CRESTOR) 5 mg tablet; Take 1 Tab by mouth nightly.    5. Migraine without aura and without status migrainosus, not intractable    Other orders  -     ketorolac (TORADOL) 10 mg tablet; Take 1 Tab by mouth every six (6) hours as needed for Pain for up to 5 days. q6h as need up to 5 days.   Indications: migarines severe         Orders Placed This Encounter   ??? DISCONTD: citalopram (CELEXA) 40 mg tablet   ??? DISCONTD: metFORMIN ER (GLUCOPHAGE XR) 500 mg tablet   ??? amLODIPine (NORVASC) 10 mg tablet   ??? citalopram (CELEXA) 40 mg tablet   ??? ketorolac (TORADOL) 10 mg tablet   ??? dapagliflozin (Farxiga) 5 mg tab tablet   ??? lisinopriL (PRINIVIL, ZESTRIL) 10 mg tablet   ??? metFORMIN ER (GLUCOPHAGE XR) 500 mg tablet   ??? rosuvastatin (CRESTOR) 5 mg tablet   ??? liraglutide (VICTOZA) 0.6 mg/0.1 mL (18 mg/3 mL) pnij     Follow-up and Dispositions    ?? Return in about 6 months (around 04/15/2020) for Chronic office visit, needs to be live visit, can be physical.

## 2019-10-17 NOTE — Progress Notes (Signed)
HISTORY OF PRESENT ILLNESS  THIS IS  VIDEO VISIT PERFORMED THRU THE SECURE WEBSITE DOXY. ME.     Diabetes:  DM follow up appt  FBS 110- 120  occ up to 180s.   PM #s [phone].   When  off metformin was up about 40 pts.    Last  Eye exam:2019, negative.  Has astigmatism but no retinopathy.   PT is taking farziga 5, metformin 500 ER  BID, victoza.    There is no medication concerns.  Denies any neurological symptoms , and cuts heal well.  Checks feet at home.      Hypertension:  PT taking lisinopril 10, amlodipine 10.      Checks BP at home BP 120s/80s      Hyperlipidemia:    On rosuvastatin 5 mg.  treating well, no  Joint  Or body aches.        Anxiety : On SSRI - celexa 20 mg.    with no s/e.  Mood is controlled and not labile.  Resting well.     working from home and anxiety has improved.   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     Lives in [place] permanently now.    Visit parents here often.     Migraines have improved and more manageable.   Less often.  .      Sandy King is a 43 y.o. female.  Past Medical History:   Diagnosis Date   ??? Acne vulgaris    ??? Anxiety    ??? Asthma    ??? Chlamydia    ??? Chondromalacia patellae of right knee    ??? Depression    ??? Diabetes (HCC)    ??? Headache(784.0)    ??? Hypertension    ??? Keloid of skin    ??? Migraines      Social History     Tobacco Use   ??? Smoking status: Never Smoker   ??? Smokeless tobacco: Never Used   Substance Use Topics   ??? Alcohol use: Yes     Comment: social   ??? Drug use: No       Family History   Problem Relation Age of Onset   ??? Breast Cancer Mother    ??? Diabetes Mother    ??? Hypertension Mother    ??? Cancer Mother         breast       Review of Systems   Constitutional: Positive for malaise/fatigue. Negative for chills, diaphoresis, fever and weight loss.   HENT: Negative for congestion.    Eyes: Negative for blurred vision.   Respiratory: Negative for cough and shortness of breath.    Cardiovascular: Negative for chest pain, palpitations, orthopnea and leg swelling.    Gastrointestinal: Negative for abdominal pain, diarrhea, heartburn and nausea.   Musculoskeletal: Negative for myalgias.   Skin: Positive for itching. Negative for rash.   Neurological: Negative for dizziness, tremors, weakness and headaches.   Psychiatric/Behavioral: Negative for depression. The patient is not nervous/anxious and does not have insomnia.             Physical Exam  Nursing note reviewed.   Constitutional:       Appearance: Normal appearance. She is obese.   HENT:      Head: Normocephalic and atraumatic.   Eyes:      Extraocular Movements: Extraocular movements intact.      Conjunctiva/sclera: Conjunctivae normal.   Pulmonary:      Effort: Pulmonary  effort is normal. No respiratory distress.   Abdominal:      Palpations: Abdomen is soft.   Neurological:      General: No focal deficit present.      Mental Status: She is alert and oriented to person, place, and time.   Psychiatric:         Mood and Affect: Mood normal.         Behavior: Behavior normal.         Thought Content: Thought content normal.         Judgment: Judgment normal.           ASSESSMENT and PLAN  Diagnoses and all orders for this visit:    1. Type 2 diabetes mellitus without complication, unspecified whether long term insulin use ([place])  Comments:  check labs.   will mail to pt.  FBS goal 80- 130  Orders:  -     dapagliflozin (Farxiga) 5 mg tab tablet; Take 1 Tab by mouth daily.  -     metFORMIN ER (GLUCOPHAGE XR) 500 mg tablet; Take 1 Tab by mouth two (2) times a day.  -     liraglutide (VICTOZA) 0.6 mg/0.1 mL (18 mg/3 mL) pnij; 1.8 mg by SubCUTAneous route daily.    2. Essential (primary) hypertension  Comments:  sounds controlled  Orders:  -     amLODIPine (NORVASC) 10 mg tablet; Take 1 Tab by mouth daily.  -     lisinopriL (PRINIVIL, ZESTRIL) 10 mg tablet; Take 1 Tab by mouth daily.    3. Generalized anxiety disorder  Comments:  improved on celexa and stable  Orders:   -     citalopram (CELEXA) 40 mg tablet; TAKE 1 TABLET BY MOUTH ONCE DAILY FOR 90 DAYS    4. Mixed hyperlipidemia  Comments:  check labs.   Orders:  -     rosuvastatin (CRESTOR) 5 mg tablet; Take 1 Tab by mouth nightly.    5. Migraine without aura and without status migrainosus, not intractable    Other orders  -     ketorolac (TORADOL) 10 mg tablet; Take 1 Tab by mouth every six (6) hours as needed for Pain for up to 5 days. q6h as need up to 5 days.   Indications: migarines severe         Orders Placed This Encounter   ??? DISCONTD: citalopram (CELEXA) 40 mg tablet   ??? DISCONTD: metFORMIN ER (GLUCOPHAGE XR) 500 mg tablet   ??? amLODIPine (NORVASC) 10 mg tablet   ??? citalopram (CELEXA) 40 mg tablet   ??? ketorolac (TORADOL) 10 mg tablet   ??? dapagliflozin (Farxiga) 5 mg tab tablet   ??? lisinopriL (PRINIVIL, ZESTRIL) 10 mg tablet   ??? metFORMIN ER (GLUCOPHAGE XR) 500 mg tablet   ??? rosuvastatin (CRESTOR) 5 mg tablet   ??? liraglutide (VICTOZA) 0.6 mg/0.1 mL (18 mg/3 mL) pnij     Follow-up and Dispositions    ?? Return in about 6 months (around 04/15/2020) for Chronic office visit, needs to be live visit, can be physical.

## 2019-11-27 ENCOUNTER — Emergency Department (HOSPITAL_BASED_OUTPATIENT_CLINIC_OR_DEPARTMENT_OTHER)
Admission: EM | Admit: 2019-11-27 | Discharge: 2019-11-27 | Disposition: A | Discharge status: Home / Self Care | Payer: Medicaid Other | Attending: Emergency Medicine | Admitting: Emergency Medicine

## 2019-11-27 ENCOUNTER — Emergency Department (HOSPITAL_BASED_OUTPATIENT_CLINIC_OR_DEPARTMENT_OTHER): Payer: Medicaid Other

## 2019-11-27 ENCOUNTER — Encounter (HOSPITAL_BASED_OUTPATIENT_CLINIC_OR_DEPARTMENT_OTHER): Payer: Self-pay

## 2019-11-27 ENCOUNTER — Other Ambulatory Visit: Payer: Self-pay

## 2019-11-27 DIAGNOSIS — E119 Type 2 diabetes mellitus without complications: Secondary | ICD-10-CM | POA: Diagnosis not present

## 2019-11-27 DIAGNOSIS — Z79899 Other long term (current) drug therapy: Secondary | ICD-10-CM | POA: Diagnosis not present

## 2019-11-27 DIAGNOSIS — U071 COVID-19: Secondary | ICD-10-CM | POA: Insufficient documentation

## 2019-11-27 DIAGNOSIS — R05 Cough: Secondary | ICD-10-CM | POA: Diagnosis present

## 2019-11-27 DIAGNOSIS — J069 Acute upper respiratory infection, unspecified: Secondary | ICD-10-CM

## 2019-11-27 DIAGNOSIS — Z7984 Long term (current) use of oral hypoglycemic drugs: Secondary | ICD-10-CM | POA: Diagnosis not present

## 2019-11-27 DIAGNOSIS — I10 Essential (primary) hypertension: Secondary | ICD-10-CM | POA: Diagnosis not present

## 2019-11-27 LAB — CBG MONITORING, ED: Glucose-Capillary: 161 mg/dL — ABNORMAL HIGH (ref 70–99)

## 2019-11-27 NOTE — ED Provider Notes (Signed)
[facility] Provider Note   CSN: [id number] Arrival date & time: 11/27/19  1948     History Chief Complaint  Patient presents with  . Cough    Cynthia Byrd is a 43 y.o. female.  HPI Patient presents with cough shortness of breath.  Has had for 9 days now.  Had a negative PCR Covid test 3 days ago through the health department.  Continued cough.  Mild sputum production.  Has had chills.  Decreased appetite.  No diarrhea.  States she had lost her sense of taste a few days ago.  No known sick contacts.    Past Medical History:  Diagnosis Date  . Diabetes mellitus without complication ([place])   . Headache   . Hypertension     There are no problems to display for this patient.   Past Surgical History:  Procedure Laterality Date  . DENTAL SURGERY    . FOOT SURGERY    . TUBAL LIGATION       OB History   No obstetric history on file.     No family history on file.  Social History   Tobacco Use  . Smoking status: Never Smoker  . Smokeless tobacco: Never Used  Substance Use Topics  . Alcohol use: Yes    Comment: occ  . Drug use: No    Home Medications Prior to Admission medications   Medication Sig Start Date End Date Taking? Authorizing Provider  amLODipine (NORVASC) 5 MG tablet Take 5 mg by mouth daily.    [provider]  aspirin-acetaminophen-caffeine (EXCEDRIN MIGRAINE) [phone] MG tablet Take by mouth every 6 (six) hours as needed for headache.    [provider]  benzonatate (TESSALON PERLES) 100 MG capsule Take 1 capsule (100 mg total) by mouth 3 (three) times daily as needed for cough. 11/10/18   Langston Masker B, PA-C  citalopram (CELEXA) 10 MG tablet Take 10 mg daily by mouth.    [provider]  glipiZIDE (GLUCOTROL) 10 MG tablet Take 10 mg by mouth 2 (two) times daily before a meal.    [provider]  guaiFENesin (MUCINEX) 600 MG 12 hr tablet Take 1 tablet (600 mg total) by mouth 2  (two) times daily. 11/10/18   Langston Masker B, PA-C  liraglutide (VICTOZA) 18 MG/3ML SOPN Inject into the skin.    [provider]  metFORMIN (GLUCOPHAGE) 1000 MG tablet Take 1,000 mg by mouth 2 (two) times daily with a meal.    [provider]  oseltamivir (TAMIFLU) 75 MG capsule Take 1 capsule (75 mg total) by mouth every 12 (twelve) hours. 01/15/17   Noe Gens, PA-C  Rizatriptan Benzoate (MAXALT PO) Take by mouth.    [provider]  Topiramate (TOPAMAX PO) Take by mouth.    [provider]    Allergies    Codeine and Percocet [oxycodone-acetaminophen]  Review of Systems   Review of Systems  Constitutional: Positive for appetite change, fatigue and fever.  Respiratory: Positive for cough and shortness of breath.   Cardiovascular: Negative for chest pain.  Gastrointestinal: Negative for abdominal pain.  Genitourinary: Negative for flank pain.  Musculoskeletal: Negative for back pain.  Neurological: Negative for weakness.  Psychiatric/Behavioral: Negative for confusion.    Physical Exam Updated Vital Signs BP (!) 127/93   Pulse (!) 106   Temp 99.9 F (37.7 C) (Oral)   Resp (!) 23   Ht 5\' 7"  (1.702 m)   Wt 125.6 kg  LMP 11/06/2019   SpO2 94%   BMI 43.38 kg/m   Physical Exam Vitals reviewed.  HENT:     Head: Normocephalic.     Mouth/Throat:     Mouth: Mucous membranes are moist.  Eyes:     Extraocular Movements: Extraocular movements intact.  Cardiovascular:     Rate and Rhythm: Tachycardia present.  Pulmonary:     Breath sounds: No wheezing, rhonchi or rales.     Comments: Mildly harsh breath sounds without focal rales or rhonchi. Abdominal:     Tenderness: There is abdominal tenderness.  Musculoskeletal:     Cervical back: Neck supple.     Right lower leg: No edema.     Left lower leg: No edema.  Skin:    General: Skin is warm.     Capillary Refill: Capillary refill takes less than 2 seconds.  Neurological:      Mental Status: She is alert and oriented to person, place, and time.     ED Results / Procedures / Treatments   Labs (all labs ordered are listed, but only abnormal results are displayed) Labs Reviewed  CBG MONITORING, ED - Abnormal; Notable for the following components:      Result Value   Glucose-Capillary 161 (*)    All other components within normal limits  RESPIRATORY PANEL BY RT PCR (FLU A&B, COVID)    EKG None  Radiology DG Chest Portable 1 View  Result Date: 11/27/2019 CLINICAL DATA:  Flu like symptoms for 9 days, negative COVID test on 12/19 by report. EXAM: PORTABLE CHEST 1 VIEW COMPARISON:  11/10/2018 FINDINGS: Cardiomediastinal contours are normal. Lungs are clear.  No signs of pleural effusion. Visualized skeletal structures are normal. IMPRESSION: No acute cardiopulmonary disease. Electronically Signed   By: Donzetta Kohut M.D.   On: 11/27/2019 20:50    Procedures Procedures (including critical care time)  Medications Ordered in ED Medications - No data to display  ED Course  I have reviewed the triage vital signs and the nursing notes.  Pertinent labs & imaging results that were available during my care of the patient were reviewed by me and considered in my medical decision making (see chart for details).    MDM Rules/Calculators/A&P                      Patient with URI symptoms.  Had outpatient Covid test that was negative.  Not hypoxic here.  However I am not sure the accuracy of that test.  X-ray reassuring.  No swelling in the legs.  Repeat Covid test and and and flu test.  Discharge home.  Patient will follow up the tests.  PCP follow-up as needed Final Clinical Impression(s) / ED Diagnoses Final diagnoses:  Upper respiratory tract infection, unspecified type    Rx / DC Orders ED Discharge Orders    None       Benjiman Core, MD 11/27/19 2216

## 2019-11-27 NOTE — Discharge Instructions (Signed)
You have a Covid test and flu test that you can look at the results of online.

## 2019-11-27 NOTE — ED Triage Notes (Signed)
Pt c/o flu like sx x 9 days-states she had neg covid test at [facility] on 12/19-NAD-steady gait

## 2019-11-28 LAB — RESPIRATORY PANEL BY RT PCR (FLU A&B, COVID)
Influenza A by PCR: NEGATIVE
Influenza B by PCR: NEGATIVE
SARS Coronavirus 2 by RT PCR: POSITIVE — AB

## 2019-12-19 ENCOUNTER — Encounter

## 2020-01-02 MED ORDER — VICTOZA 2-PAK 0.6 MG/0.1 ML (18 MG/3 ML) SUBCUTANEOUS PEN INJECTOR
0.6 mg/0.1 mL (18 mg/3 mL) | SUBCUTANEOUS | 1 refills | Status: DC
Start: 2020-01-02 — End: 2020-01-21

## 2020-01-13 ENCOUNTER — Encounter

## 2020-01-22 MED ORDER — VICTOZA 2-PAK 0.6 MG/0.1 ML (18 MG/3 ML) SUBCUTANEOUS PEN INJECTOR
0.6 mg/0.1 mL (18 mg/3 mL) | SUBCUTANEOUS | 1 refills | Status: DC
Start: 2020-01-22 — End: 2020-01-23

## 2020-01-23 ENCOUNTER — Telehealth

## 2020-01-23 MED ORDER — VICTOZA 2-PAK 0.6 MG/0.1 ML (18 MG/3 ML) SUBCUTANEOUS PEN INJECTOR
0.6 mg/0.1 mL (18 mg/3 mL) | SUBCUTANEOUS | 1 refills | Status: DC
Start: 2020-01-23 — End: 2020-11-14

## 2020-03-21 ENCOUNTER — Other Ambulatory Visit: Payer: Self-pay

## 2020-03-21 ENCOUNTER — Encounter (HOSPITAL_BASED_OUTPATIENT_CLINIC_OR_DEPARTMENT_OTHER): Payer: Self-pay | Admitting: *Deleted

## 2020-03-21 ENCOUNTER — Emergency Department (HOSPITAL_BASED_OUTPATIENT_CLINIC_OR_DEPARTMENT_OTHER): Payer: Medicaid Other

## 2020-03-21 ENCOUNTER — Emergency Department (HOSPITAL_BASED_OUTPATIENT_CLINIC_OR_DEPARTMENT_OTHER)
Admission: EM | Admit: 2020-03-21 | Discharge: 2020-03-21 | Disposition: A | Discharge status: Home / Self Care | Payer: Medicaid Other | Attending: Emergency Medicine | Admitting: Emergency Medicine

## 2020-03-21 DIAGNOSIS — N3090 Cystitis, unspecified without hematuria: Secondary | ICD-10-CM | POA: Insufficient documentation

## 2020-03-21 DIAGNOSIS — I10 Essential (primary) hypertension: Secondary | ICD-10-CM | POA: Insufficient documentation

## 2020-03-21 DIAGNOSIS — R1030 Lower abdominal pain, unspecified: Secondary | ICD-10-CM | POA: Diagnosis present

## 2020-03-21 DIAGNOSIS — Z7984 Long term (current) use of oral hypoglycemic drugs: Secondary | ICD-10-CM | POA: Insufficient documentation

## 2020-03-21 DIAGNOSIS — Z79899 Other long term (current) drug therapy: Secondary | ICD-10-CM | POA: Diagnosis not present

## 2020-03-21 DIAGNOSIS — E119 Type 2 diabetes mellitus without complications: Secondary | ICD-10-CM | POA: Diagnosis not present

## 2020-03-21 LAB — COMPREHENSIVE METABOLIC PANEL
ALT: 12 U/L (ref 0–44)
AST: 19 U/L (ref 15–41)
Albumin: 3.7 g/dL (ref 3.5–5.0)
Alkaline Phosphatase: 51 U/L (ref 38–126)
Anion gap: 10 (ref 5–15)
BUN: 7 mg/dL (ref 6–20)
CO2: 25 mmol/L (ref 22–32)
Calcium: 9 mg/dL (ref 8.9–10.3)
Chloride: 100 mmol/L (ref 98–111)
Creatinine, Ser: 0.73 mg/dL (ref 0.44–1.00)
GFR calc Af Amer: >60 mL/min (ref >60)
GFR calc non Af Amer: >60 mL/min (ref >60)
Glucose, Bld: 208 mg/dL — ABNORMAL HIGH (ref 70–99)
Potassium: 4.2 mmol/L (ref 3.5–5.1)
Sodium: 135 mmol/L (ref 135–145)
Total Bilirubin: 0.6 mg/dL (ref 0.3–1.2)
Total Protein: 7.9 g/dL (ref 6.5–8.1)

## 2020-03-21 LAB — CBC WITH DIFFERENTIAL/PLATELET
Abs Immature Granulocytes: 0.05 10*3/uL (ref 0.00–0.07)
Basophils Absolute: 0.1 10*3/uL (ref 0.0–0.1)
Basophils Relative: 1 %
Eosinophils Absolute: 0.1 10*3/uL (ref 0.0–0.5)
Eosinophils Relative: 1 %
HCT: 44.2 % (ref 36.0–46.0)
Hemoglobin: 14.1 g/dL (ref 12.0–15.0)
Immature Granulocytes: 1 %
Lymphocytes Relative: 23 %
Lymphs Abs: 2.3 10*3/uL (ref 0.7–4.0)
MCH: 25.4 pg — ABNORMAL LOW (ref 26.0–34.0)
MCHC: 31.9 g/dL (ref 30.0–36.0)
MCV: 79.5 fL — ABNORMAL LOW (ref 80.0–100.0)
Monocytes Absolute: 0.5 10*3/uL (ref 0.1–1.0)
Monocytes Relative: 6 %
Neutro Abs: 6.7 10*3/uL (ref 1.7–7.7)
Neutrophils Relative %: 68 %
Platelets: 263 10*3/uL (ref 150–400)
RBC: 5.56 MIL/uL — ABNORMAL HIGH (ref 3.87–5.11)
RDW: 14.9 % (ref 11.5–15.5)
WBC: 9.8 10*3/uL (ref 4.0–10.5)
nRBC: 0 % (ref 0.0–0.2)

## 2020-03-21 LAB — URINALYSIS, ROUTINE W REFLEX MICROSCOPIC
Bilirubin Urine: NEGATIVE
Glucose, UA: >=500 mg/dL — AB
Ketones, ur: 15 mg/dL — AB
Leukocytes,Ua: NEGATIVE
Nitrite: NEGATIVE
Protein, ur: >300 mg/dL — AB
Specific Gravity, Urine: 1.02 (ref 1.005–1.030)
pH: 6.5 (ref 5.0–8.0)

## 2020-03-21 LAB — URINALYSIS, MICROSCOPIC (REFLEX)

## 2020-03-21 LAB — PREGNANCY, URINE: Preg Test, Ur: NEGATIVE

## 2020-03-21 MED ORDER — CEPHALEXIN 500 MG PO CAPS: 500.0000 mg | ORAL_CAPSULE | Freq: Three times a day (TID) | ORAL | 0 refills | Status: AC

## 2020-03-21 MED ORDER — SODIUM CHLORIDE 0.9 % IV SOLN
1.0000 g | Freq: Once | INTRAVENOUS | Status: AC
  Administered 2020-03-21: 1 g via INTRAVENOUS
  Filled 2020-03-21: qty 10

## 2020-03-21 MED ORDER — PHENAZOPYRIDINE HCL 200 MG PO TABS: 200.0000 mg | ORAL_TABLET | Freq: Three times a day (TID) | ORAL | 0 refills | Status: AC

## 2020-03-21 MED ORDER — IOHEXOL 300 MG/ML  SOLN
100.0000 mL | Freq: Once | INTRAMUSCULAR | Status: AC | PRN
  Administered 2020-03-21: 100 mL via INTRAVENOUS

## 2020-03-21 MED ORDER — FENTANYL CITRATE (PF) 100 MCG/2ML IJ SOLN
50.0000 ug | Freq: Once | INTRAMUSCULAR | Status: AC
  Administered 2020-03-21: 50 ug via INTRAVENOUS
  Filled 2020-03-21: qty 2

## 2020-03-21 NOTE — ED Notes (Signed)
Taken to ct at this time. 

## 2020-03-21 NOTE — ED Triage Notes (Signed)
Constipation for a week. She used enemas x 2 today. Some relief with mag citrate a few days ago.

## 2020-03-21 NOTE — ED Provider Notes (Signed)
[facility] EMERGENCY DEPARTMENT Provider Note  CSN: [id number] Arrival date & time: 03/21/20 1707    History Chief Complaint  Patient presents with  . Constipation    HPI  Cynthia Byrd is a 44 y.o. female with history of DM presents to the emergency department for evaluation of abdominal pain. She reports intermittent, gradually worsening lower abdominal pain for the last week associated with constipation with no significant stool output since Saturday, 6 days ago. She has taken MagCitrate and enema x 2 and reports some liquid stool but still feels like there is some in there that hasn't come out. She does not have a history of constipation. No fevers. Pain is worse with movement and palpation.  No prior abdominal surgeries except tubal ligation.    Past Medical History:  Diagnosis Date  . Diabetes mellitus without complication (HCC)   . Headache   . Hypertension     Past Surgical History:  Procedure Laterality Date  . DENTAL SURGERY    . FOOT SURGERY    . TUBAL LIGATION      No family history on file.  Social History   Tobacco Use  . Smoking status: Never Smoker  . Smokeless tobacco: Never Used  Substance Use Topics  . Alcohol use: Yes    Comment: occ  . Drug use: No     Home Medications Prior to Admission medications   Medication Sig Start Date End Date Taking? Authorizing Provider  amLODipine (NORVASC) 5 MG tablet Take 5 mg by mouth daily.   Yes [provider]  liraglutide (VICTOZA) 18 MG/3ML SOPN Inject into the skin.   Yes [provider]  metFORMIN (GLUCOPHAGE) 1000 MG tablet Take 1,000 mg by mouth 2 (two) times daily with a meal.   Yes [provider]  aspirin-acetaminophen-caffeine (EXCEDRIN MIGRAINE) 250-250-65 MG tablet Take by mouth every 6 (six) hours as needed for headache.    [provider]  benzonatate (TESSALON PERLES) 100 MG capsule Take 1 capsule (100 mg total) by mouth 3 (three) times daily as  needed for cough. 11/10/18   Aviva Kluver B, PA-C  cephALEXin (KEFLEX) 500 MG capsule Take 1 capsule (500 mg total) by mouth 3 (three) times daily for 7 days. 03/21/20 03/28/20  Pollyann Savoy, MD  citalopram (CELEXA) 10 MG tablet Take 10 mg daily by mouth.    [provider]  glipiZIDE (GLUCOTROL) 10 MG tablet Take 10 mg by mouth 2 (two) times daily before a meal.    [provider]  guaiFENesin (MUCINEX) 600 MG 12 hr tablet Take 1 tablet (600 mg total) by mouth 2 (two) times daily. 11/10/18   Aviva Kluver B, PA-C  oseltamivir (TAMIFLU) 75 MG capsule Take 1 capsule (75 mg total) by mouth every 12 (twelve) hours. 01/15/17   Lurene Shadow, PA-C  phenazopyridine (PYRIDIUM) 200 MG tablet Take 1 tablet (200 mg total) by mouth 3 (three) times daily. 03/21/20   Pollyann Savoy, MD  Rizatriptan Benzoate (MAXALT PO) Take by mouth.    [provider]  Topiramate (TOPAMAX PO) Take by mouth.    [provider]     Allergies    Codeine and Percocet [oxycodone-acetaminophen]   Review of Systems   Review of Systems  Constitutional: Negative for fever.  HENT: Negative for congestion and sore throat.   Respiratory: Negative for cough and shortness of breath.   Cardiovascular: Negative for chest pain.  Gastrointestinal: Positive for abdominal pain and constipation. Negative  for diarrhea, nausea and vomiting.  Genitourinary: Negative for dysuria.  Musculoskeletal: Negative for myalgias.  Skin: Negative for rash.  Neurological: Negative for headaches.  Psychiatric/Behavioral: Negative for behavioral problems.     Physical Exam BP 134/90 (BP Location: Right Arm)   Pulse (!) 109   Temp 97.9 F (36.6 C) (Oral)   Resp 20   Ht 5\' 7"  (1.702 m)   Wt 125.6 kg   LMP 02/26/2020   SpO2 100%   BMI 43.37 kg/m   Physical Exam Vitals and nursing note reviewed.  Constitutional:      Appearance: Normal appearance.  HENT:     Head: Normocephalic and atraumatic.       Nose: Nose normal.     Mouth/Throat:     Mouth: Mucous membranes are moist.  Eyes:     Extraocular Movements: Extraocular movements intact.     Conjunctiva/sclera: Conjunctivae normal.  Cardiovascular:     Rate and Rhythm: Normal rate.  Pulmonary:     Effort: Pulmonary effort is normal.     Breath sounds: Normal breath sounds.  Abdominal:     General: Abdomen is flat.     Palpations: Abdomen is soft.     Tenderness: There is abdominal tenderness (LLQ). There is guarding.  Musculoskeletal:        General: No swelling. Normal range of motion.     Cervical back: Neck supple.  Skin:    General: Skin is warm and dry.  Neurological:     General: No focal deficit present.     Mental Status: She is alert.  Psychiatric:        Mood and Affect: Mood normal.      ED Results / Procedures / Treatments   Labs (all labs ordered are listed, but only abnormal results are displayed) Labs Reviewed  CBC WITH DIFFERENTIAL/PLATELET - Abnormal; Notable for the following components:      Result Value   RBC 5.56 (*)    MCV 79.5 (*)    MCH 25.4 (*)    All other components within normal limits  COMPREHENSIVE METABOLIC PANEL - Abnormal; Notable for the following components:   Glucose, Bld 208 (*)    All other components within normal limits  URINALYSIS, ROUTINE W REFLEX MICROSCOPIC - Abnormal; Notable for the following components:   APPearance CLOUDY (*)    Glucose, UA >=500 (*)    Hgb urine dipstick SMALL (*)    Ketones, ur 15 (*)    Protein, ur >300 (*)    All other components within normal limits  URINALYSIS, MICROSCOPIC (REFLEX) - Abnormal; Notable for the following components:   Bacteria, UA FEW (*)    All other components within normal limits  URINE CULTURE  PREGNANCY, URINE    EKG None   Radiology CT Abdomen Pelvis W Contrast  Result Date: 03/21/2020 CLINICAL DATA:  Abdominal abscess, infection suspected, lower abdominal pain and associated constipation for 6 days with  abdominal distension EXAM: CT ABDOMEN AND PELVIS WITH CONTRAST TECHNIQUE: Multidetector CT imaging of the abdomen and pelvis was performed using the standard protocol following bolus administration of intravenous contrast. CONTRAST:  193mL OMNIPAQUE IOHEXOL 300 MG/ML  SOLN COMPARISON:  None FINDINGS: Lower chest: Lung bases are clear. Normal heart size. No pericardial effusion. Hepatobiliary: No focal liver lesion. Smooth liver surface contour. Normal hepatic attenuation. Gallbladder contains multiple partially calcified and several pneumatized gallstones. No pericholecystic fluid or inflammation. No calcified intraductal gallstones. Pancreas: Unremarkable. No pancreatic ductal dilatation or surrounding inflammatory  changes. Spleen: Normal in size without focal abnormality. Adrenals/Urinary Tract: Normal adrenal glands. Question some faint striation of the left renal nephrogram. There is extensive urothelial thickening involving both ureters with marked periureteral fat stranding to the level of a partially decompressed but circumferentially thickened urinary bladder. No visible or contour deforming renal lesions. No obstructive urolithiasis or hydronephrosis is evident. Stomach/Bowel: Distal esophagus, stomach and duodenal sweep are unremarkable. No small bowel wall thickening or dilatation. No evidence of obstruction. A normal appendix is visualized. No colonic dilatation or wall thickening. No large stool burden is evident. Vascular/Lymphatic: The aorta is normal caliber. Major venous structures are unremarkable. No suspicious or enlarged lymph nodes in the included lymphatic chains. Reproductive: Anteverted uterus. Normal appearance of the ovaries. Some adjacent stranding in the adnexa appears centered upon the ureters, as detailed above. Other: Retroperitoneal fat stranding centered upon the thickened hyperemic ureters and about the urinary bladder in the pelvis. No abdominopelvic free fluid or air. No bowel  containing hernia. Musculoskeletal: No acute osseous abnormality or suspicious osseous lesion. IMPRESSION: 1. Extensive urothelial thickening involving both ureters with marked periureteral fat stranding to the level of a partially decompressed but circumferentially thickened urinary bladder. Question some faint striation of the left renal nephrogram. Findings are concerning for a ascending urinary tract infection. Correlation with urinalysis is recommended. 2. Cholelithiasis without evidence of acute cholecystitis. 3. Bowel is unremarkable without large stool burden or features of constipation. Electronically Signed   By: Kreg Shropshire M.D.   On: 03/21/2020 20:08    Procedures Procedures  Medications Ordered in the ED Medications  iohexol (OMNIPAQUE) 300 MG/ML solution 100 mL (100 mLs Intravenous Contrast Given 03/21/20 1953)  cefTRIAXone (ROCEPHIN) 1 g in sodium chloride 0.9 % 100 mL IVPB (1 g Intravenous New Bag/Given 03/21/20 2052)  fentaNYL (SUBLIMAZE) injection 50 mcg (50 mcg Intravenous Given 03/21/20 2052)     ED Course  I have reviewed the triage vital signs and the nursing notes.  Pertinent labs & imaging results that were available during my care of the patient were reviewed by me and considered in my medical decision making (see chart for details).  Clinical Course as of Mar 21 2125  Fri Mar 21, 2020  2040 And CT reviewed, her white blood cell count is normal and her chemistry panel shows a mild elevated glucose but otherwise no concerning findings.  Her urinalysis concerning for a urinary tract infection and her CT confirms findings of cystitis with an early pyelonephritis.  She does not have fever or leukocytosis here concerning for sepsis.  She will be given a dose of Rocephin and some pain medicine in the emergency department and prescription for Keflex and Pyridium to take at home.  Primary care follow-up and return to the emergency department for any other concerns.   [CS]      Clinical Course User Index [CS] Pollyann Savoy, MD    MDM Rules/Calculators/A&P MDM Number of Diagnoses or Management Options Diagnosis management comments: Patient here with chief complaint of constipation however exam is more concerning for intra-abdominal infection such as diverticulitis.  We will check her basic labs including CBC, chemistry panel and urinalysis.  She will be sent for CT scan to evaluate for intra-abdominal infection or less likely a small bowel obstruction.    Amount and/or Complexity of Data Reviewed Clinical lab tests: ordered and reviewed Tests in the radiology section of CPT: ordered and reviewed Review and summarize past medical records: yes Independent visualization of images, tracings,  or specimens: yes  Risk of Complications, Morbidity, and/or Mortality Presenting problems: high Diagnostic procedures: high Management options: high    Final Clinical Impression(s) / ED Diagnoses Final diagnoses:  Cystitis    Rx / DC Orders ED Discharge Orders         Ordered    phenazopyridine (PYRIDIUM) 200 MG tablet  3 times daily     03/21/20 2126    cephALEXin (KEFLEX) 500 MG capsule  3 times daily     03/21/20 2126           Pollyann Savoy, MD 03/21/20 2126

## 2020-03-24 LAB — URINE CULTURE: Culture: 80000 — AB

## 2020-03-25 ENCOUNTER — Telehealth: Payer: Self-pay | Admitting: Emergency Medicine

## 2020-03-25 NOTE — Telephone Encounter (Signed)
Post ED Visit - Positive Culture Follow-up  Culture report reviewed by antimicrobial stewardship pharmacist: [facility] []  , Pharm.D. []  Enzo Bi, Pharm.D., BCPS AQ-ID []  , Pharm.D., BCPS []  Celedonio Miyamoto, Pharm.D., BCPS []  [place], Garvin Fila.D., BCPS, AAHIVP []  , Pharm.D., BCPS, AAHIVP []  Georgina Pillion, PharmD, BCPS []  , PharmD, BCPS []  [place], PharmD, BCPS []  [address], PharmD []  , PharmD, BCPS []  Estella Husk, PharmD  Pharmacy Team []  Lysle Pearl, PharmD []  , PharmD []  Phillips Climes, PharmD []  , Rph []  Agapito Games) , PharmD []  Verlan Friends, PharmD []  , PharmD []  Mervyn Gay, PharmD []  , PharmD []  Vinnie Level, PharmD []  [facility], PharmD []  , PharmD []  Len Childs, PharmD   Positive urine culture Treated with cephalexin, organism sensitive to the same and no further patient follow-up is required at this time.  03/25/2020, 11:50 AM

## 2020-04-23 ENCOUNTER — Ambulatory Visit: Admit: 2020-04-23 | Discharge: 2020-04-23 | Payer: MEDICAID | Attending: Family | Primary: Family Medicine

## 2020-04-23 ENCOUNTER — Ambulatory Visit: Attending: Family | Primary: Family Medicine

## 2020-04-23 DIAGNOSIS — E119 Type 2 diabetes mellitus without complications: Secondary | ICD-10-CM

## 2020-04-23 MED ORDER — SITAGLIPTIN 100 MG TAB
100 mg | ORAL_TABLET | Freq: Every day | ORAL | 1 refills | Status: DC
Start: 2020-04-23 — End: 2020-11-14

## 2020-04-23 MED ORDER — ROSUVASTATIN 5 MG TAB
5 mg | ORAL_TABLET | Freq: Every evening | ORAL | 1 refills | Status: DC
Start: 2020-04-23 — End: 2020-11-14

## 2020-04-23 MED ORDER — METFORMIN SR 500 MG 24 HR TABLET
500 mg | ORAL_TABLET | Freq: Two times a day (BID) | ORAL | 1 refills | Status: DC
Start: 2020-04-23 — End: 2020-11-14

## 2020-04-23 MED ORDER — AMLODIPINE 10 MG TAB
10 mg | ORAL_TABLET | Freq: Every day | ORAL | 1 refills | Status: DC
Start: 2020-04-23 — End: 2020-11-14

## 2020-04-23 MED ORDER — CITALOPRAM 40 MG TAB
40 mg | ORAL_TABLET | ORAL | 1 refills | Status: DC
Start: 2020-04-23 — End: 2020-11-14

## 2020-04-23 MED ORDER — DAPAGLIFLOZIN 5 MG TABLET
5 mg | ORAL_TABLET | Freq: Every day | ORAL | 1 refills | Status: DC
Start: 2020-04-23 — End: 2020-11-14

## 2020-04-23 MED ORDER — LISINOPRIL 10 MG TAB
10 mg | ORAL_TABLET | Freq: Every day | ORAL | 1 refills | Status: DC
Start: 2020-04-23 — End: 2020-11-14

## 2020-04-23 MED ORDER — OZEMPIC 1 MG/DOSE (2 MG/1.5 ML) SUBCUTANEOUS PEN INJECTOR
1 mg/dose (2 mg/.5 mL) | SUBCUTANEOUS | 1 refills | Status: DC
Start: 2020-04-23 — End: 2020-05-29

## 2020-04-23 NOTE — Progress Notes (Signed)
Please call patient about labs.     A1c over 11. Clearly get on the extra med I prescribed, ill see you in 3 months let us know if you arent getting the meds please

## 2020-04-23 NOTE — Progress Notes (Signed)
I spoke to pt about her lab results. Pt want to know about her microalbumin test was that ok? LE

## 2020-04-23 NOTE — Progress Notes (Signed)
HISTORY OF PRESENT ILLNESS  Sandy King is a 44 y.o. female presents for   Chief Complaint   Patient presents with   ??? Medication Evaluation       Follow up for diabetes :    [] Checks glucose at home and generally gets :     Last A1c is/were   Lab Results   Component Value Date/Time    Hemoglobin A1c, External 12.3 12/08/2018 12:00 AM       [x] states eyes examined by opthalmology in last year  [x] denies numbness or tingling in extremities  [] diet controlled only    Meds taken:  [x] Metformin  [x] denies GI side effects like diarrhea or nausea  GLP1's [] trulicity [] Ozempic [x] victoza [] bydureon  SGLT2 [] canagliflozin (Invokana??), [x] dapagliflozin (Farxiga??), [] empagliflozin (Jardiance??), []  ertugliflozin (Steglatro)  DPP4's [] Januvia Sitagliptin []  Onglyza Saxagliptin [] Tradjenta linagliptin    OTHER:  [] Glipizide [] Glimeperide [] Glyburide [] Pioglitazone (Actos) [] Nateglinide (Starlix??) [] Rosiglitazone (Avandia??)      [] history of amputation due to diabetes  [] history of neuropathy due to diabetes  Denies neuropathy and or amputations    [x] Bloodpressure well controlled at home and monitors with home device  [x] Denies Headache, Chest pain, Visual changes, Dizziness, and difficulty breathing  [x] Is taking current home meds as prescribed    Takes: [ARBs] [] Valsartan; [] Losartan; []  Olmesartan;[]  Irbesartan  [ACE inhibitors] [x] Lisinopril; [] Enalapril; [] Ramipril; [] Benazapril  [CCB] [x] Amlodipine [] Cardizem/Diltiazem  [Diuretics] [] HCTZ; [] Chlorthalidone; [] Lasix; [] Indapamide;  [B blockers][] Atenolol; [] Metoprolol; [] Propanolol; [] Bystolic (nebivolol); [] Carvediolol;   [other] [] Clonidine tabs scheduled [] Clonidine Tabs PRN [] Clonidine patches  []  other [as listed on meds]    Follow up on Mixed hyperlipidemia:     [x] takes meds as prescribed    [x] Denies new muscle aches or pains in joints and denies fatigue    [x] Watching diet less fatty fried and fast foods    [x] denies chest pain or diff breathing    Takes:  []   Simvastatin [x] Crestor (rosuvastatin) [] atorvastatin [] pravastatin [] lovastatin     [] Zetia    []  Fish oil [] Vascepa    [] Gemfobrozil []  FenoFibrate    []  Nexlitol [] Nexlizet    Vitals:    04/23/20 0737   BP: 130/85   BP 1 Location: Right arm   BP Patient Position: Sitting   Pulse: 86   Resp: 16   Temp: 98.4 ??F (36.9 ??C)   TempSrc: Oral   Weight: 273 lb (123.8 kg)   Height: 5' 7.5" (1.715 m)      Patient Active Problem List   Diagnosis Code   ??? 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 Migraine without aura and without status migrainosus, not intractable G43.009   ??? Sleep disorder G47.9   ??? Type 2 diabetes mellitus with hyperglycemia (HCC) E11.65   ??? Essential hypertension I10   ??? Morbid obesity (HCC) E66.01   ??? Anxiety disorder F41.9   ??? Hyperlipidemia E78.5   ??? Microalbuminuria R80.9     Patient Active Problem List    Diagnosis Date Noted   ??? Anxiety disorder 07/17/2019   ??? Hyperlipidemia 07/17/2019   ??? Microalbuminuria 07/17/2019   ??? Type 2 diabetes mellitus with hyperglycemia (HCC) 07/31/2018   ??? Essential hypertension 07/31/2018   ??? Morbid obesity (HCC) 07/31/2018   ??? Analgesic overuse headache 10/05/2012   ??? Migraine without aura and without status migrainosus, not intractable 10/05/2012   ??? Sleep disorder 10/05/2012     Current Outpatient Medications   Medication Sig Dispense Refill   ??? liraglutide (Victoza 2-Pak) 0.6 mg/0.1 mL (18 mg/3 mL) pnij INJECT  1.8MG  UNDER THE SKIN EVERY DAY 9 mL 1   ??? amLODIPine (NORVASC) 10 mg tablet Take 1 Tab by mouth daily. 90 Tab 1   ??? citalopram (CELEXA) 40 mg tablet TAKE 1 TABLET BY MOUTH ONCE DAILY FOR 90 DAYS 90 Tab 1   ??? dapagliflozin (Farxiga) 5 mg tab tablet Take 1 Tab by mouth daily. 90 Tab 1   ??? lisinopriL (PRINIVIL, ZESTRIL) 10 mg tablet Take 1 Tab by mouth daily. 90 Tab 1   ??? metFORMIN ER (GLUCOPHAGE XR) 500 mg tablet Take 1 Tab by mouth two (2) times a day. 180 Tab 1   ??? rosuvastatin (CRESTOR) 5 mg tablet Take 1 Tab by mouth nightly. 90 Tab 1   ??? Blood Sugar  Diagnostic, Drum (Accu-Chek Compact Plus Test) strp by Does Not Apply route.     ??? Insulin Needles, Disposable, (BD Ultra-Fine Short Pen Needle) 31 gauge x 5/16" ndle by Does Not Apply route.     ??? glucose blood VI test strips (TRUE METRIX GLUCOSE TEST STRIP) strip Test BID Dx Code: E11.65 200 Strip 3   ??? lancets (TRUEPLUS LANCETS) 33 gauge misc Test BID Dx Code: E11.65 200 Lancet 3     Allergies   Allergen Reactions   ??? Codeine Other (comments)     Dizziness/delirium   ??? Metformin Diarrhea   ??? Percocet [Oxycodone-Acetaminophen] Other (comments)     dizziness     Past Medical History:   Diagnosis Date   ??? Acne vulgaris    ??? Anxiety    ??? Asthma    ??? Chlamydia    ??? Chondromalacia patellae of right knee    ??? Depression    ??? Diabetes ([place])    ??? Headache(784.0)    ??? Hypertension    ??? Keloid of skin    ??? Migraines      Past Surgical History:   Procedure Laterality Date   ??? HX CESAREAN SECTION       Family History   Problem Relation Age of Onset   ??? Breast Cancer Mother    ??? Diabetes Mother    ??? Hypertension Mother    ??? Cancer Mother         breast     Social History     Tobacco Use   ??? Smoking status: Never Smoker   ??? Smokeless tobacco: Never Used   Substance Use Topics   ??? Alcohol use: Yes     Comment: social           Review of Systems   Constitutional: Negative for fever and weight loss.   HENT: Negative for sore throat and tinnitus.    Eyes: Negative for blurred vision and double vision.   Respiratory: Negative for shortness of breath.    Cardiovascular: Negative for chest pain, palpitations and leg swelling.   Gastrointestinal: Negative for constipation and diarrhea.   Genitourinary:        Yeast infections   Skin: Negative for itching and rash.   Neurological: Negative for dizziness.   Endo/Heme/Allergies: Does not bruise/bleed easily.   Psychiatric/Behavioral: Negative for depression. The patient is not nervous/anxious and does not have insomnia.        Physical Exam  Vitals reviewed.   Constitutional:        Appearance: Normal appearance. She is obese.   HENT:      Head: Normocephalic.      Nose: Nose normal.      Mouth/Throat:  Mouth: Mucous membranes are moist.   Eyes:      Extraocular Movements: Extraocular movements intact.      Pupils: Pupils are equal, round, and reactive to light.   Cardiovascular:      Rate and Rhythm: Normal rate and regular rhythm.      Pulses: Normal pulses.           Dorsalis pedis pulses are 2+ on the right side and 2+ on the left side.        Posterior tibial pulses are 2+ on the right side and 2+ on the left side.      Heart sounds: Normal heart sounds.   Pulmonary:      Effort: Pulmonary effort is normal.      Breath sounds: Normal breath sounds.   Musculoskeletal:         General: Normal range of motion.      Cervical back: Normal range of motion and neck supple.      Right foot: Normal range of motion. No deformity.      Left foot: Normal range of motion. No deformity.   Feet:      Right foot:      Protective Sensation: 5 sites tested. 5 sites sensed.      Skin integrity: No ulcer, skin breakdown, erythema, warmth, callus, dry skin or fissure.      Toenail Condition: Right toenails are normal.      Left foot:      Protective Sensation: 5 sites tested. 5 sites sensed.      Skin integrity: No ulcer, skin breakdown, erythema, warmth, callus, dry skin or fissure.      Toenail Condition: Left toenails are normal.   Skin:     General: Skin is warm and dry.   Neurological:      General: No focal deficit present.      Mental Status: She is alert and oriented to person, place, and time.   Psychiatric:         Attention and Perception: Attention and perception normal.         Mood and Affect: Affect normal.         Speech: Speech normal.         Behavior: Behavior normal. Behavior is cooperative.         Thought Content: Thought content normal.         Cognition and Memory: Cognition and memory normal.         Judgment: Judgment normal.           ASSESSMENT and PLAN  Diagnoses and all orders  for this visit:    1. Type 2 diabetes mellitus without complication, unspecified whether long term insulin use (HCC)  Comments:  check labs.  Poorly controlled diabetes has been that way for years will be more aggressive and see her every 3 months at least via virtual with labs  Orders:  -     CBC WITH AUTOMATED DIFF  -     METABOLIC PANEL, COMPREHENSIVE  -     HEMOGLOBIN A1C WITH EAG  -     LIPID PANEL  -     MICROALBUMIN, UR, RAND W/ MICROALB/CREAT RATIO  -     metFORMIN ER (GLUCOPHAGE XR) 500 mg tablet; Take 1 Tablet by mouth two (2) times a day.  -     semaglutide (Ozempic) 1 mg/dose (2 mg/1.5 mL) sub-q pen; 0.75 mg by SubCUTAneous route every seven (7) days.  -  dapagliflozin (Farxiga) 5 mg tab tablet; Take 1 Tablet by mouth daily.  -     SITagliptin (JANUVIA) 100 mg tablet; Take 1 Tablet by mouth daily.    2. Mixed hyperlipidemia  Comments:  check labs.  Likely tied to her diabetes  Orders:  -     rosuvastatin (CRESTOR) 5 mg tablet; Take 1 Tablet by mouth nightly.    3. Essential (primary) hypertension  Comments:  Controlled in office follow-up every 3 months for diabetes and check blood pressure  Orders:  -     lisinopriL (PRINIVIL, ZESTRIL) 10 mg tablet; Take 1 Tablet by mouth daily.  -     amLODIPine (NORVASC) 10 mg tablet; Take 1 Tablet by mouth daily.    4. 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 Class 3 severe obesity due to excess calories with serious comorbidity and body mass index (BMI) of 40.0 to 44.9 in adult [facility])  Comments:  Likely contributing to insulin resistance and her diabetes will attempt to get some weight loss with a GLP-1 has been on Victoza will switch to Ozempic       Spent 1 hour with  patient and consultation and charting  Jonnie Finner, NP

## 2020-04-23 NOTE — Progress Notes (Signed)
1. Have you been to the ER, urgent care clinic since your last visit?  Hospitalized since your last visit?No    2. Have you seen or consulted any other health care providers outside of the [facility] since your last visit?  Include any pap smears or colon screening. No

## 2020-04-23 NOTE — Progress Notes (Signed)
I spoke to pt. LE

## 2020-04-23 NOTE — Progress Notes (Signed)
I spoke to pt about her lab results. Pt want to know about her microalbumin test was that ok? LE

## 2020-04-23 NOTE — Patient Instructions (Signed)
Learning About Being Physically Active  What is physical activity?     Being physically active means doing any kind of activity that gets your body moving.  The types of physical activity that can help you get fit and stay healthy include:  ?? Aerobic or "cardio" activities. These make your heart beat faster and make you breathe harder, such as brisk walking, riding a bike, or running. They strengthen your heart and lungs and build up your endurance.  ?? Strength training activities. These make your muscles work against, or "resist," something. Examples include lifting weights or doing push-ups. These activities help tone and strengthen your muscles and bones.  ?? Stretches. These let you move your joints and muscles through their full range of motion. Stretching helps you be more flexible.  What are the benefits of being active?  Being active is one of the best things you can do for your health. It helps you to:  ?? Feel stronger and have more energy to do all the things you like to do.  ?? Focus better at school or work.  ?? Feel, think, and sleep better.  ?? Reach and stay at a healthy weight.  ?? Lose fat and build lean muscle.  ?? Lower your risk for serious health problems, including diabetes, heart attack, high blood pressure, and some cancers.  ?? Keep your heart, lungs, bones, muscles, and joints strong and healthy.  How can you make being active part of your life?  Start slowly. Make it your long-term goal to get at least 30 minutes of exercise on most days of the week. Walking is a good choice. You also may want to do other activities, such as running, swimming, cycling, or playing tennis or team sports.  Pick activities that you like???ones that make your heart beat faster, your muscles stronger, and your muscles and joints more flexible. If you find more than one thing you like doing, do them all. You don't have to do the same thing every day.  Get your heart pumping every day. Any activity that makes your  heart beat faster and keeps it at that rate for a while counts.  Here are some great ways to get your heart beating faster:  ?? Go for a brisk walk, run, or bike ride.  ?? Go for a hike or swim.  ?? Go in-line skating.  ?? Play a game of touch football, basketball, or soccer.  ?? Ride a bike.  ?? Play tennis or racquetball.  ?? Climb stairs.  Even some household chores can be aerobic???just do them at a faster pace. Vacuuming, raking or mowing the lawn, sweeping the garage, and washing and waxing the car all can help get your heart rate up.  Strengthen your muscles during the week. You don't have to lift heavy weights or grow big, bulky muscles to get stronger. Doing a few simple activities that make your muscles work against, or "resist," something can help you get stronger.  For example, you can:  ?? Do push-ups or sit-ups, which use your own body weight as resistance.  ?? Lift weights or dumbbells or use stretch bands at home or in a gym or community center.  Stretch your muscles often. Stretching will help you as you become more active. It can help you stay flexible, loosen tight muscles, and avoid injury. It can also help improve your balance and posture and can be a great way to relax.  Be sure to stretch the muscles you'll   be using when you work out. It's best to warm your muscles slightly before you stretch them. Walk or do some other light aerobic activity for a few minutes, and then start stretching.  When you stretch your muscles:  ?? Do it slowly. Stretching is not about going fast or making sudden movements.  ?? Don't push or bounce during a stretch.  ?? Hold each stretch for at least 15 to 30 seconds, if you can. You should feel a stretch in the muscle, but not pain.  ?? Breathe out as you do the stretch. Then breathe in as you hold the stretch. Don't hold your breath.  If you're worried about how more activity might affect your health, have a checkup before you start. Follow any special advice your doctor gives you  for getting a smart start.  Where can you learn more?  Go to https://www.healthwise.net/[facility]  Enter W332 in the search box to learn more about "Learning About Being Physically Active."  Current as of: August 16, 2019??????????????????????????????Content Version: 12.8  ?? 2006-2021 [employer].   Care instructions adapted under license by [employer] (which disclaims liability or warranty for this information). If you have questions about a medical condition or this instruction, always ask your healthcare professional. Healthwise, Inc[employer] disclaims any warranty or liability for your use of this information.

## 2020-04-23 NOTE — Progress Notes (Signed)
Please call patient about labs.     A1c over 11. Clearly get on the extra med I prescribed, ill see you in 3 months let us know if you arent getting the meds please

## 2020-04-23 NOTE — Progress Notes (Signed)
1. Have you been to the ER, urgent care clinic since your last visit?  Hospitalized since your last visit?No    2. Have you seen or consulted any other health care providers outside of the [facility] since your last visit?  Include any pap smears or colon screening. No

## 2020-04-23 NOTE — Progress Notes (Signed)
HISTORY OF PRESENT ILLNESS  Sandy King is a 44 y.o. female presents for   Chief Complaint   Patient presents with   ??? Medication Evaluation       Follow up for diabetes :    [] Checks glucose at home and generally gets :     Last A1c is/were   Lab Results   Component Value Date/Time    Hemoglobin A1c, External 12.3 12/08/2018 12:00 AM       [x] states eyes examined by opthalmology in last year  [x] denies numbness or tingling in extremities  [] diet controlled only    Meds taken:  [x] Metformin  [x] denies GI side effects like diarrhea or nausea  GLP1's [] trulicity [] Ozempic [x] victoza [] bydureon  SGLT2 [] canagliflozin (Invokana??), [x] dapagliflozin (Farxiga??), [] empagliflozin (Jardiance??), []  ertugliflozin (Steglatro)  DPP4's [] Januvia Sitagliptin []  Onglyza Saxagliptin [] Tradjenta linagliptin    OTHER:  [] Glipizide [] Glimeperide [] Glyburide [] Pioglitazone (Actos) [] Nateglinide (Starlix??) [] Rosiglitazone (Avandia??)      [] history of amputation due to diabetes  [] history of neuropathy due to diabetes  Denies neuropathy and or amputations    [x] Bloodpressure well controlled at home and monitors with home device  [x] Denies Headache, Chest pain, Visual changes, Dizziness, and difficulty breathing  [x] Is taking current home meds as prescribed    Takes: [ARBs] [] Valsartan; [] Losartan; []  Olmesartan;[]  Irbesartan  [ACE inhibitors] [x] Lisinopril; [] Enalapril; [] Ramipril; [] Benazapril  [CCB] [x] Amlodipine [] Cardizem/Diltiazem  [Diuretics] [] HCTZ; [] Chlorthalidone; [] Lasix; [] Indapamide;  [B blockers][] Atenolol; [] Metoprolol; [] Propanolol; [] Bystolic (nebivolol); [] Carvediolol;   [other] [] Clonidine tabs scheduled [] Clonidine Tabs PRN [] Clonidine patches  []  other [as listed on meds]    Follow up on Mixed hyperlipidemia:     [x] takes meds as prescribed    [x] Denies new muscle aches or pains in joints and denies fatigue    [x] Watching diet less fatty fried and fast foods    [x] denies chest pain or diff breathing    Takes:  []   Simvastatin [x] Crestor (rosuvastatin) [] atorvastatin [] pravastatin [] lovastatin     [] Zetia    []  Fish oil [] Vascepa    [] Gemfobrozil []  FenoFibrate    []  Nexlitol [] Nexlizet    Vitals:    04/23/20 0737   BP: 130/85   BP 1 Location: Right arm   BP Patient Position: Sitting   Pulse: 86   Resp: 16   Temp: 98.4 ??F (36.9 ??C)   TempSrc: Oral   Weight: 273 lb (123.8 kg)   Height: 5' 7.5" (1.715 m)      Patient Active Problem List   Diagnosis Code   ??? 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 Migraine without aura and without status migrainosus, not intractable G43.009   ??? Sleep disorder G47.9   ??? Type 2 diabetes mellitus with hyperglycemia (HCC) E11.65   ??? Essential hypertension I10   ??? Morbid obesity (HCC) E66.01   ??? Anxiety disorder F41.9   ??? Hyperlipidemia E78.5   ??? Microalbuminuria R80.9     Patient Active Problem List    Diagnosis Date Noted   ??? Anxiety disorder 07/17/2019   ??? Hyperlipidemia 07/17/2019   ??? Microalbuminuria 07/17/2019   ??? Type 2 diabetes mellitus with hyperglycemia (HCC) 07/31/2018   ??? Essential hypertension 07/31/2018   ??? Morbid obesity (HCC) 07/31/2018   ??? Analgesic overuse headache 10/05/2012   ??? Migraine without aura and without status migrainosus, not intractable 10/05/2012   ??? Sleep disorder 10/05/2012     Current Outpatient Medications   Medication Sig Dispense Refill   ??? liraglutide (Victoza 2-Pak) 0.6 mg/0.1 mL (18 mg/3 mL) pnij INJECT  1.8MG  UNDER THE SKIN EVERY DAY 9 mL 1   ??? amLODIPine (NORVASC) 10 mg tablet Take 1 Tab by mouth daily. 90 Tab 1   ??? citalopram (CELEXA) 40 mg tablet TAKE 1 TABLET BY MOUTH ONCE DAILY FOR 90 DAYS 90 Tab 1   ??? dapagliflozin (Farxiga) 5 mg tab tablet Take 1 Tab by mouth daily. 90 Tab 1   ??? lisinopriL (PRINIVIL, ZESTRIL) 10 mg tablet Take 1 Tab by mouth daily. 90 Tab 1   ??? metFORMIN ER (GLUCOPHAGE XR) 500 mg tablet Take 1 Tab by mouth two (2) times a day. 180 Tab 1   ??? rosuvastatin (CRESTOR) 5 mg tablet Take 1 Tab by mouth nightly. 90 Tab 1   ??? Blood Sugar  Diagnostic, Drum (Accu-Chek Compact Plus Test) strp by Does Not Apply route.     ??? Insulin Needles, Disposable, (BD Ultra-Fine Short Pen Needle) 31 gauge x 5/16" ndle by Does Not Apply route.     ??? glucose blood VI test strips (TRUE METRIX GLUCOSE TEST STRIP) strip Test BID Dx Code: E11.65 200 Strip 3   ??? lancets (TRUEPLUS LANCETS) 33 gauge misc Test BID Dx Code: E11.65 200 Lancet 3     Allergies   Allergen Reactions   ??? Codeine Other (comments)     Dizziness/delirium   ??? Metformin Diarrhea   ??? Percocet [Oxycodone-Acetaminophen] Other (comments)     dizziness     Past Medical History:   Diagnosis Date   ??? Acne vulgaris    ??? Anxiety    ??? Asthma    ??? Chlamydia    ??? Chondromalacia patellae of right knee    ??? Depression    ??? Diabetes ([place])    ??? Headache(784.0)    ??? Hypertension    ??? Keloid of skin    ??? Migraines      Past Surgical History:   Procedure Laterality Date   ??? HX CESAREAN SECTION       Family History   Problem Relation Age of Onset   ??? Breast Cancer Mother    ??? Diabetes Mother    ??? Hypertension Mother    ??? Cancer Mother         breast     Social History     Tobacco Use   ??? Smoking status: Never Smoker   ??? Smokeless tobacco: Never Used   Substance Use Topics   ??? Alcohol use: Yes     Comment: social           Review of Systems   Constitutional: Negative for fever and weight loss.   HENT: Negative for sore throat and tinnitus.    Eyes: Negative for blurred vision and double vision.   Respiratory: Negative for shortness of breath.    Cardiovascular: Negative for chest pain, palpitations and leg swelling.   Gastrointestinal: Negative for constipation and diarrhea.   Genitourinary:        Yeast infections   Skin: Negative for itching and rash.   Neurological: Negative for dizziness.   Endo/Heme/Allergies: Does not bruise/bleed easily.   Psychiatric/Behavioral: Negative for depression. The patient is not nervous/anxious and does not have insomnia.        Physical Exam  Vitals reviewed.   Constitutional:        Appearance: Normal appearance. She is obese.   HENT:      Head: Normocephalic.      Nose: Nose normal.      Mouth/Throat:  Mouth: Mucous membranes are moist.   Eyes:      Extraocular Movements: Extraocular movements intact.      Pupils: Pupils are equal, round, and reactive to light.   Cardiovascular:      Rate and Rhythm: Normal rate and regular rhythm.      Pulses: Normal pulses.           Dorsalis pedis pulses are 2+ on the right side and 2+ on the left side.        Posterior tibial pulses are 2+ on the right side and 2+ on the left side.      Heart sounds: Normal heart sounds.   Pulmonary:      Effort: Pulmonary effort is normal.      Breath sounds: Normal breath sounds.   Musculoskeletal:         General: Normal range of motion.      Cervical back: Normal range of motion and neck supple.      Right foot: Normal range of motion. No deformity.      Left foot: Normal range of motion. No deformity.   Feet:      Right foot:      Protective Sensation: 5 sites tested. 5 sites sensed.      Skin integrity: No ulcer, skin breakdown, erythema, warmth, callus, dry skin or fissure.      Toenail Condition: Right toenails are normal.      Left foot:      Protective Sensation: 5 sites tested. 5 sites sensed.      Skin integrity: No ulcer, skin breakdown, erythema, warmth, callus, dry skin or fissure.      Toenail Condition: Left toenails are normal.   Skin:     General: Skin is warm and dry.   Neurological:      General: No focal deficit present.      Mental Status: She is alert and oriented to person, place, and time.   Psychiatric:         Attention and Perception: Attention and perception normal.         Mood and Affect: Affect normal.         Speech: Speech normal.         Behavior: Behavior normal. Behavior is cooperative.         Thought Content: Thought content normal.         Cognition and Memory: Cognition and memory normal.         Judgment: Judgment normal.           ASSESSMENT and PLAN  Diagnoses and all orders  for this visit:    1. Type 2 diabetes mellitus without complication, unspecified whether long term insulin use (HCC)  Comments:  check labs.  Poorly controlled diabetes has been that way for years will be more aggressive and see her every 3 months at least via virtual with labs  Orders:  -     CBC WITH AUTOMATED DIFF  -     METABOLIC PANEL, COMPREHENSIVE  -     HEMOGLOBIN A1C WITH EAG  -     LIPID PANEL  -     MICROALBUMIN, UR, RAND W/ MICROALB/CREAT RATIO  -     metFORMIN ER (GLUCOPHAGE XR) 500 mg tablet; Take 1 Tablet by mouth two (2) times a day.  -     semaglutide (Ozempic) 1 mg/dose (2 mg/1.5 mL) sub-q pen; 0.75 mg by SubCUTAneous route every seven (7) days.  -       dapagliflozin (Farxiga) 5 mg tab tablet; Take 1 Tablet by mouth daily.  -     SITagliptin (JANUVIA) 100 mg tablet; Take 1 Tablet by mouth daily.    2. Mixed hyperlipidemia  Comments:  check labs.  Likely tied to her diabetes  Orders:  -     rosuvastatin (CRESTOR) 5 mg tablet; Take 1 Tablet by mouth nightly.    3. Essential (primary) hypertension  Comments:  Controlled in office follow-up every 3 months for diabetes and check blood pressure  Orders:  -     lisinopriL (PRINIVIL, ZESTRIL) 10 mg tablet; Take 1 Tablet by mouth daily.  -     amLODIPine (NORVASC) 10 mg tablet; Take 1 Tablet by mouth daily.    4. 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 Class 3 severe obesity due to excess calories with serious comorbidity and body mass index (BMI) of 40.0 to 44.9 in adult [facility])  Comments:  Likely contributing to insulin resistance and her diabetes will attempt to get some weight loss with a GLP-1 has been on Victoza will switch to Ozempic       Spent 1 hour with  patient and consultation and charting  Jonnie Finner, NP

## 2020-04-24 LAB — MICROALBUMIN / CREATININE URINE RATIO
Creatinine, Ur: 193.6 mg/dL
Microalb, Ur: 160.2 ug/mL
Microalbumin Creatinine Ratio: 83 mg/g creat — ABNORMAL HIGH (ref 0–29)

## 2020-04-24 LAB — CBC WITH AUTO DIFFERENTIAL
Basophils %: 1 %
Basophils Absolute: 0.1 10*3/uL (ref 0.0–0.2)
Eosinophils %: 3 %
Eosinophils Absolute: 0.2 10*3/uL (ref 0.0–0.4)
Granulocyte Absolute Count: 0 10*3/uL (ref 0.0–0.1)
Hematocrit: 42.7 % (ref 34.0–46.6)
Hemoglobin: 13.5 g/dL (ref 11.1–15.9)
Immature Granulocytes: 0 %
Lymphocytes %: 33 %
Lymphocytes Absolute: 2 10*3/uL (ref 0.7–3.1)
MCH: 25.3 pg — ABNORMAL LOW (ref 26.6–33.0)
MCHC: 31.6 g/dL (ref 31.5–35.7)
MCV: 80 fL (ref 79–97)
Monocytes %: 7 %
Monocytes Absolute: 0.4 10*3/uL (ref 0.1–0.9)
Neutrophils %: 56 %
Neutrophils Absolute: 3.4 10*3/uL (ref 1.4–7.0)
Platelets: 221 10*3/uL (ref 150–450)
RBC: 5.33 x10E6/uL — ABNORMAL HIGH (ref 3.77–5.28)
RDW: 15.1 % (ref 11.7–15.4)
WBC: 6.1 10*3/uL (ref 3.4–10.8)

## 2020-04-24 LAB — LIPID PANEL
Cholesterol, Total: 194 mg/dL (ref 100–199)
Cholesterol, total: 194 mg/dL (ref 100–199)
HDL Cholesterol: 38 mg/dL — ABNORMAL LOW (ref >39)
HDL: 38 mg/dL — ABNORMAL LOW (ref >39)
LDL Calculated: 133 mg/dL — ABNORMAL HIGH (ref 0–99)
LDL, calculated: 133 mg/dL — ABNORMAL HIGH (ref 0–99)
Triglyceride: 128 mg/dL (ref 0–149)
Triglycerides: 128 mg/dL (ref 0–149)
VLDL, calculated: 23 mg/dL (ref 5–40)
VLDL: 23 mg/dL (ref 5–40)

## 2020-04-24 LAB — COMPREHENSIVE METABOLIC PANEL
ALT: 9 IU/L (ref 0–32)
AST: 12 IU/L (ref 0–40)
Albumin/Globulin Ratio: 1.4 NA (ref 1.2–2.2)
Albumin: 4.1 g/dL (ref 3.8–4.8)
Alkaline Phosphatase: 50 IU/L (ref 48–121)
BUN: 13 mg/dL (ref 6–24)
Bun/Cre Ratio: 16 NA (ref 9–23)
CO2: 25 mmol/L (ref 20–29)
Calcium: 9.2 mg/dL (ref 8.7–10.2)
Chloride: 98 mmol/L (ref 96–106)
Creatinine: 0.79 mg/dL (ref 0.57–1.00)
EGFR IF NonAfrican American: 92 mL/min/{1.73_m2} (ref >59)
GFR African American: 106 mL/min/{1.73_m2} (ref >59)
Globulin, Total: 2.9 g/dL (ref 1.5–4.5)
Glucose: 239 mg/dL — ABNORMAL HIGH (ref 65–99)
Potassium: 3.9 mmol/L (ref 3.5–5.2)
Sodium: 137 mmol/L (ref 134–144)
Total Bilirubin: 0.5 mg/dL (ref 0.0–1.2)
Total Protein: 7 g/dL (ref 6.0–8.5)

## 2020-04-24 LAB — HEMOGLOBIN A1C W/EAG
Hemoglobin A1C: 11.6 % — ABNORMAL HIGH (ref 4.8–5.6)
eAG: 286 mg/dL

## 2020-04-24 LAB — METABOLIC PANEL, COMPREHENSIVE
A-G Ratio: 1.4 (ref 1.2–2.2)
ALT (SGPT): 9 IU/L (ref 0–32)
AST (SGOT): 12 IU/L (ref 0–40)
Albumin: 4.1 g/dL (ref 3.8–4.8)
Alk. phosphatase: 50 IU/L (ref 48–121)
BUN/Creatinine ratio: 16 (ref 9–23)
BUN: 13 mg/dL (ref 6–24)
Bilirubin, total: 0.5 mg/dL (ref 0.0–1.2)
CO2: 25 mmol/L (ref 20–29)
Calcium: 9.2 mg/dL (ref 8.7–10.2)
Chloride: 98 mmol/L (ref 96–106)
Creatinine: 0.79 mg/dL (ref 0.57–1.00)
GFR est AA: 106 mL/min/{1.73_m2} (ref >59)
GFR est non-AA: 92 mL/min/{1.73_m2} (ref >59)
GLOBULIN, TOTAL: 2.9 g/dL (ref 1.5–4.5)
Glucose: 239 mg/dL — ABNORMAL HIGH (ref 65–99)
Potassium: 3.9 mmol/L (ref 3.5–5.2)
Protein, total: 7 g/dL (ref 6.0–8.5)
Sodium: 137 mmol/L (ref 134–144)

## 2020-04-24 LAB — CBC WITH AUTOMATED DIFF
ABS. BASOPHILS: 0.1 10*3/uL (ref 0.0–0.2)
ABS. EOSINOPHILS: 0.2 10*3/uL (ref 0.0–0.4)
ABS. IMM. GRANS.: 0 10*3/uL (ref 0.0–0.1)
ABS. MONOCYTES: 0.4 10*3/uL (ref 0.1–0.9)
ABS. NEUTROPHILS: 3.4 10*3/uL (ref 1.4–7.0)
Abs Lymphocytes: 2 10*3/uL (ref 0.7–3.1)
BASOPHILS: 1 %
EOSINOPHILS: 3 %
HCT: 42.7 % (ref 34.0–46.6)
HGB: 13.5 g/dL (ref 11.1–15.9)
IMMATURE GRANULOCYTES: 0 %
Lymphocytes: 33 %
MCH: 25.3 pg — ABNORMAL LOW (ref 26.6–33.0)
MCHC: 31.6 g/dL (ref 31.5–35.7)
MCV: 80 fL (ref 79–97)
MONOCYTES: 7 %
NEUTROPHILS: 56 %
PLATELET: 221 10*3/uL (ref 150–450)
RBC: 5.33 x10E6/uL — ABNORMAL HIGH (ref 3.77–5.28)
RDW: 15.1 % (ref 11.7–15.4)
WBC: 6.1 10*3/uL (ref 3.4–10.8)

## 2020-04-24 LAB — MICROALBUMIN, UR, RAND W/ MICROALB/CREAT RATIO
Creatinine, urine random: 193.6 mg/dL
Microalb/Creat ratio (ug/mg creat.): 83 mg/g creat — ABNORMAL HIGH (ref 0–29)
Microalbumin, urine: 160.2 ug/mL

## 2020-04-24 LAB — HEMOGLOBIN A1C WITH EAG
Estimated average glucose: 286 mg/dL
Hemoglobin A1c: 11.6 % — ABNORMAL HIGH (ref 4.8–5.6)

## 2020-04-28 NOTE — Telephone Encounter (Signed)
Patient called and stated they have not heard anything about for the urinalysis that was done last week. Patient wants to know if results for this can be given to her sometime. Best call back number [phone]

## 2020-04-28 NOTE — Telephone Encounter (Signed)
Patient called and stated they have not heard anything about for the urinalysis that was done last week. Patient wants to know if results for this can be given to her sometime. Best call back number [phone]

## 2020-05-13 NOTE — Telephone Encounter (Signed)
Patient stated that she called yesterday wanting to know the status of the prior authorization for her medication.  She said that the paperwork was resent this morning, and she wants to make sure that you received it.  Please call her at [phone]

## 2020-05-20 NOTE — Telephone Encounter (Signed)
PRIOR AUTHORIZATION    Prior Authorization started 05/20/2020    Medication: Ozempic through CoverMyMeds    Result:Approved    Patient notified: via phone call that medication was approved    If denied, provider was notified via message to inbox.

## 2020-05-27 ENCOUNTER — Telehealth

## 2020-05-27 NOTE — Telephone Encounter (Signed)
Pt called and said she pick up her Rx of the Ozempic 1mg . Pt want to know do you want her to take the 1mg  dose or do the 0.75mg  dose because the direction said 0.75 for her to take. Pt said she will be ok if you want her to start the 1mg  dose.  LE

## 2020-05-28 NOTE — Telephone Encounter (Signed)
I spoke to pt. Pt states she is going to try the 1mg  to see how that works for her. Pt said she will call back if she have any problems with it. Pt states if she have to go to a 0.75mg  she will need a Rx for that one but she is going to try the 1mg  first. LE

## 2020-05-28 NOTE — Telephone Encounter (Signed)
I called pt and left her a voice mail. LE

## 2020-05-29 MED ORDER — OZEMPIC 1 MG/DOSE (2 MG/1.5 ML) SUBCUTANEOUS PEN INJECTOR
1 mg/dose (2 mg/.5 mL) | SUBCUTANEOUS | 1 refills | Status: DC
Start: 2020-05-29 — End: 2020-07-28

## 2020-07-24 ENCOUNTER — Telehealth: Payer: BLUE CROSS/BLUE SHIELD | Attending: Family | Primary: Family Medicine

## 2020-07-24 NOTE — Progress Notes (Signed)
This encounter was created in error - please disregard.

## 2020-07-27 ENCOUNTER — Encounter

## 2020-07-28 MED ORDER — OZEMPIC 1 MG/DOSE (2 MG/1.5 ML) SUBCUTANEOUS PEN INJECTOR
1 mg/dose (2 mg/.5 mL) | SUBCUTANEOUS | 0 refills | Status: DC
Start: 2020-07-28 — End: 2020-10-31

## 2020-08-04 ENCOUNTER — Encounter: Payer: MEDICAID | Attending: Family | Primary: Family Medicine

## 2020-08-13 NOTE — Telephone Encounter (Signed)
Spoke with patient. She wants to follow you to your new location. I did let her know that you said that it was important that she be seen for her diabetes very soon.

## 2020-10-29 ENCOUNTER — Encounter: Attending: Family Medicine | Primary: Family Medicine

## 2020-10-29 ENCOUNTER — Encounter

## 2020-11-04 MED ORDER — OZEMPIC 1 MG/DOSE (4 MG/3 ML) SUBCUTANEOUS PEN INJECTOR
1 mg/dose (4 mg/3 mL) | SUBCUTANEOUS | 2 refills | Status: DC
Start: 2020-11-04 — End: 2020-11-18

## 2020-11-14 ENCOUNTER — Ambulatory Visit: Admit: 2020-11-14 | Discharge: 2020-11-14 | Payer: MEDICAID | Attending: Family Medicine | Primary: Family Medicine

## 2020-11-14 ENCOUNTER — Ambulatory Visit: Attending: Family Medicine | Primary: Family Medicine

## 2020-11-14 DIAGNOSIS — E119 Type 2 diabetes mellitus without complications: Secondary | ICD-10-CM

## 2020-11-14 MED ORDER — LISINOPRIL 10 MG TAB
10 mg | ORAL_TABLET | Freq: Every day | ORAL | 1 refills | Status: DC
Start: 2020-11-14 — End: 2021-12-18

## 2020-11-14 MED ORDER — DAPAGLIFLOZIN 5 MG TABLET
5 mg | ORAL_TABLET | Freq: Every day | ORAL | 1 refills | Status: DC
Start: 2020-11-14 — End: 2021-09-01

## 2020-11-14 MED ORDER — ROSUVASTATIN 5 MG TAB
5 mg | ORAL_TABLET | Freq: Every evening | ORAL | 1 refills | Status: DC
Start: 2020-11-14 — End: 2021-03-26

## 2020-11-14 MED ORDER — SITAGLIPTIN 100 MG TAB
100 mg | ORAL_TABLET | Freq: Every day | ORAL | 1 refills | Status: DC
Start: 2020-11-14 — End: 2020-11-18

## 2020-11-14 MED ORDER — AMLODIPINE 10 MG TAB
10 mg | ORAL_TABLET | Freq: Every day | ORAL | 1 refills | Status: DC
Start: 2020-11-14 — End: 2021-12-18

## 2020-11-14 MED ORDER — METFORMIN SR 500 MG 24 HR TABLET
500 mg | ORAL_TABLET | Freq: Two times a day (BID) | ORAL | 1 refills | Status: DC
Start: 2020-11-14 — End: 2021-09-01

## 2020-11-14 MED ORDER — CITALOPRAM 40 MG TAB
40 mg | ORAL_TABLET | ORAL | 1 refills | Status: DC
Start: 2020-11-14 — End: 2021-12-18

## 2020-11-14 NOTE — Progress Notes (Signed)
Progress  Notes by Dorinda Hill, MD at 11/14/20 1330                Author: Dorinda Hill, MD  Service: --  Author Type: Physician       Filed: 11/14/20 1438  Encounter Date: 11/14/2020  Status: Signed          Editor: Dorinda Hill, MD (Physician)                    HPI        Chief Complaint:      Chief Complaint       Patient presents with        ?  Follow-up             DM            HPI:   Sandy King How is a 44 y.o.  female.   Patient is new to me.   Chart reviewed. Meds, labs, notes reviewed.       Uncontrolled T2DM: She rarely checks fasting. 2hPP lunch 160s-180. No increased thirst or urination. Was out of ozempic for a month but restarted it 5 days ago.      Lab Results         Component  Value  Date/Time            Hemoglobin A1c  11.6 (H)  04/23/2020 08:40 AM            Hemoglobin A1c, External  12.3  12/08/2018 12:00 AM         Diabetes Checklist   ACE inhibitor: taking lisinopril 10mg  daily   Last Lipid panel: UTD   Statin: taking   Last Microalbumin: UTD   Pneumonia vaccine 19-64?:    Last seen by opthalmology: 2020, need to get records, she says she was told no retinopathy.       HTN: Complaint with medications. No cough or chest pain.       HLD: Forgets to take statin, but when she does, no myalgias.       Lab Results         Component  Value  Date/Time            Cholesterol, total  194  04/23/2020 08:40 AM       HDL Cholesterol  38 (L)  04/23/2020 08:40 AM       LDL, calculated  133 (H)  04/25/2020 08:40 AM       VLDL, calculated  23  04/23/2020 08:40 AM            Triglyceride  128  04/23/2020 08:40 AM         Obesity: Patient is not doing much exercise.      Generalized anxiety disorder: She feels stable on Celexa.  Denies HI or SI.  She reports being compliant with medication.      Lump on right breast: Has been present for quite some time.  Is not sure.  Is not tenderness.  She states that she easily gets keloids.  Would  like to get this checked out.  Not associated with nipple  drainage.  Has not tried anything to help.      Review of Systems    Constitutional: Negative for chills and fever.    Eyes: Negative for blurred vision and double vision.    Respiratory: Negative for cough and shortness of breath.     Cardiovascular: Negative for chest pain and  palpitations.    Gastrointestinal: Negative for abdominal pain and blood in stool.    Musculoskeletal: Negative for joint pain and myalgias.    Skin: Positive for rash. Negative for itching.    Psychiatric/Behavioral: Negative for depression and suicidal ideas.          Reviewed PmHx, FmHx, SocHx as well as meds and allergies, updated and dated in the chart.        Past Medical History:        Diagnosis  Date         ?  Acne vulgaris       ?  Anxiety       ?  Asthma       ?  Chlamydia       ?  Chondromalacia patellae of right knee       ?  Depression       ?  Diabetes (HCC)       ?  Headache(784.0)       ?  Hypertension       ?  Keloid of skin           ?  Migraines            Social History          Tobacco Use         ?  Smoking status:  Never Smoker     ?  Smokeless tobacco:  Never Used       Vaping Use         ?  Vaping Use:  Never used       Substance Use Topics         ?  Alcohol use:  Yes             Comment: social         ?  Drug use:  No             Current Outpatient Medications on File Prior to Visit          Medication  Sig  Dispense  Refill           ?  Ozempic 1 mg/dose (4 mg/3 mL) pnij  1 mg by SubCUTAneous route every seven (7) days.  1 Each  2     ?  [DISCONTINUED] metFORMIN ER (GLUCOPHAGE XR) 500 mg tablet  Take 1 Tablet by mouth two (2) times a day.  180 Tablet  1     ?  [DISCONTINUED] rosuvastatin (CRESTOR) 5 mg tablet  Take 1 Tablet by mouth nightly.  90 Tablet  1     ?  [DISCONTINUED] lisinopriL (PRINIVIL, ZESTRIL) 10 mg tablet  Take 1 Tablet by mouth daily.  90 Tablet  1           ?  [DISCONTINUED] citalopram (CELEXA) 40 mg tablet  TAKE 1 TABLET BY MOUTH ONCE DAILY FOR 90 DAYS  90 Tablet  1           ?  [DISCONTINUED]  amLODIPine (NORVASC) 10 mg tablet  Take 1 Tablet by mouth daily.  90 Tablet  1     ?  [DISCONTINUED] dapagliflozin (Farxiga) 5 mg tab tablet  Take 1 Tablet by mouth daily.  90 Tablet  1     ?  [DISCONTINUED] SITagliptin (JANUVIA) 100 mg tablet  Take 1 Tablet by mouth daily. (Patient not taking: Reported on 11/14/2020)  90 Tablet  1     ?  [  DISCONTINUED] liraglutide (Victoza 2-Pak) 0.6 mg/0.1 mL (18 mg/3 mL) pnij  INJECT 1.8MG  UNDER THE SKIN EVERY DAY  9 mL  1     ?  Blood Sugar Diagnostic, Drum (Accu-Chek Compact Plus Test) strp  by Does Not Apply route.         ?  Insulin Needles, Disposable, (BD Ultra-Fine Short Pen Needle) 31 gauge x 5/16" ndle  by Does Not Apply route.         ?  glucose blood VI test strips (TRUE METRIX GLUCOSE TEST STRIP) strip  Test BID Dx Code: E11.65  200 Strip  3           ?  lancets (TRUEPLUS LANCETS) 33 gauge misc  Test BID Dx Code: E11.65  200 Lancet  3          No current facility-administered medications on file prior to visit.             Allergies        Allergen  Reactions         ?  Codeine  Other (comments)             Dizziness/delirium         ?  Metformin  Diarrhea     ?  Percocet [Oxycodone-Acetaminophen]  Other (comments)             dizziness                Objective        Visit Vitals      BP  138/86 (BP 1 Location: Left upper arm, BP Patient Position: Sitting, BP Cuff Size: Adult)     Pulse  96     Temp  97.4 ??F (36.3 ??C) (Temporal)     Resp  18     Ht  5\' 8"  (1.727 m)     Wt  269 lb 6.4 oz (122.2 kg)     SpO2  98%        BMI  40.96 kg/m??        Physical Exam   Vitals and nursing note reviewed.   Constitutional:        Appearance: Normal appearance.   HENT :       Head: Normocephalic and atraumatic.      Right Ear: External ear normal.      Left Ear: External ear normal.    Eyes:       Extraocular Movements: Extraocular movements intact.      Conjunctiva/sclera: Conjunctivae normal.   Cardiovascular:       Rate and Rhythm: Normal rate and regular rhythm.      Heart  sounds: Normal heart sounds. No murmur heard.        Pulmonary:       Effort: Pulmonary effort is normal. No respiratory distress.      Breath sounds: Normal breath sounds.   Chest:             Comments: Hyperkeratotic lesion to the right breast at the 10 o'clock position.  There were no other skin changes.  No peau de  orange appearance.  No drainage from the nipples.    Neurological :       General: No focal deficit present.      Mental Status: She is alert and oriented to person, place, and time.                 Assessment and Plan  Diagnoses and all orders for this visit:      1. Type 2 diabetes mellitus without complication, unspecified whether long term insulin use (HCC)   Comments:   Labs show diabetes always poorly controlled.  And hopefully clinically better controlled now per her history likely will still have room for improvement   Orders:   -     HEMOGLOBIN A1C WITH EAG   -     SITagliptin (JANUVIA) 100 mg tablet; Take 1 Tablet by mouth daily.   -     metFORMIN ER (GLUCOPHAGE XR) 500 mg tablet; Take 1 Tablet by mouth two (2) times a day.   -     dapagliflozin (Farxiga) 5 mg tab tablet; Take 1 Tablet by mouth daily.      2. Mixed hyperlipidemia   Comments:   Check labs.  Encouraged to take her statin nightly.  Encouraged heart healthy diet.   Orders:   -     METABOLIC PANEL, COMPREHENSIVE   -     LIPID PANEL   -     rosuvastatin (CRESTOR) 5 mg tablet; Take 1 Tablet by mouth nightly.      3. Essential (primary) hypertension   Comments:   At goal on repeat.  We will continue to closely monitor.   Orders:   -     CBC W/O DIFF   -     METABOLIC PANEL, COMPREHENSIVE   -     lisinopriL (PRINIVIL, ZESTRIL) 10 mg tablet; Take 1 Tablet by mouth daily.   -     amLODIPine (NORVASC) 10 mg tablet; Take 1 Tablet by mouth daily.      4. Class 3 severe obesity due to excess calories with serious comorbidity and body mass index (BMI) of 40.0 to 44.9 in adult [facility])   Comments:   Continue to encourage patient to stay  active.  Decrease caloric intake.      5. Generalized anxiety disorder   Comments:   Stable.   Orders:   -     citalopram (CELEXA) 40 mg tablet; TAKE 1 TABLET BY MOUTH ONCE DAILY FOR 90 DAYS      6. Refused influenza vaccine      7. COVID-19 vaccination refused   Comments:   Had COVID 19 in 2020.      8. Papule of skin   Comments:   On right breast, appears hyperkeratotic.  She is to apply topical hydrocortisone twice a day.  Follow-up if not improving or worsening.            Medication Side Effects and Warnings were discussed with patient, as indicated.   Patient Labs were reviewed and or requested, as indicated.   Patient Past Records were reviewed and or requested, as indicated.        Follow-up and Dispositions      ??  Return in about 4 months (around 03/15/2021).                    Tren??e Sherry Ruffing, MD   [address] [facility]   [address] [address] [facility]

## 2020-11-14 NOTE — Progress Notes (Signed)
Diabetes better but still not at goal.

## 2020-11-14 NOTE — Progress Notes (Signed)
 Visit Vitals  BP (!) 150/90 (BP 1 Location: Left upper arm, BP Patient Position: Sitting, BP Cuff Size: Large adult)   Pulse 96   Temp 97.4 F (36.3 C) (Temporal)   Resp 18   Ht 5' 8 (1.727 m)   Wt 269 lb 6.4 oz (122.2 kg)   SpO2 98%   BMI 40.96 kg/m     Chief Complaint   Patient presents with   . Follow-up     DM     1. Have you been to the ER, urgent care clinic since your last visit?  Hospitalized since your last visit?No    2. Have you seen or consulted any other health care providers outside of the [facility] since your last visit?  Include any pap smears or colon screening. No

## 2020-11-15 LAB — COMPREHENSIVE METABOLIC PANEL
ALT: 11 IU/L (ref 0–32)
AST: 12 IU/L (ref 0–40)
Albumin/Globulin Ratio: 1.5 NA (ref 1.2–2.2)
Albumin: 4.3 g/dL (ref 3.8–4.8)
Alkaline Phosphatase: 52 IU/L (ref 44–121)
BUN: 10 mg/dL (ref 6–24)
Bun/Cre Ratio: 13 NA (ref 9–23)
CO2: 26 mmol/L (ref 20–29)
Calcium: 9.6 mg/dL (ref 8.7–10.2)
Chloride: 97 mmol/L (ref 96–106)
Creatinine: 0.75 mg/dL (ref 0.57–1.00)
EGFR IF NonAfrican American: 97 mL/min/{1.73_m2} (ref >59)
GFR African American: 112 mL/min/{1.73_m2} (ref >59)
Globulin, Total: 2.9 g/dL (ref 1.5–4.5)
Glucose: 151 mg/dL — ABNORMAL HIGH (ref 65–99)
Potassium: 4 mmol/L (ref 3.5–5.2)
Sodium: 137 mmol/L (ref 134–144)
Total Bilirubin: 0.5 mg/dL (ref 0.0–1.2)
Total Protein: 7.2 g/dL (ref 6.0–8.5)

## 2020-11-15 LAB — CBC
Hematocrit: 41.7 % (ref 34.0–46.6)
Hemoglobin: 13.7 g/dL (ref 11.1–15.9)
MCH: 26.2 pg — ABNORMAL LOW (ref 26.6–33.0)
MCHC: 32.9 g/dL (ref 31.5–35.7)
MCV: 80 fL (ref 79–97)
Platelets: 271 10*3/uL (ref 150–450)
RBC: 5.23 x10E6/uL (ref 3.77–5.28)
RDW: 13.8 % (ref 11.7–15.4)
WBC: 7.6 10*3/uL (ref 3.4–10.8)

## 2020-11-15 LAB — LIPID PANEL
Cholesterol, Total: 186 mg/dL (ref 100–199)
Cholesterol, total: 186 mg/dL (ref 100–199)
HDL Cholesterol: 41 mg/dL (ref >39)
HDL: 41 mg/dL (ref >39)
LDL Calculated: 126 mg/dL — ABNORMAL HIGH (ref 0–99)
LDL, calculated: 126 mg/dL — ABNORMAL HIGH (ref 0–99)
Triglyceride: 104 mg/dL (ref 0–149)
Triglycerides: 104 mg/dL (ref 0–149)
VLDL, calculated: 19 mg/dL (ref 5–40)
VLDL: 19 mg/dL (ref 5–40)

## 2020-11-15 LAB — HEMOGLOBIN A1C W/EAG
Hemoglobin A1C: 9 % — ABNORMAL HIGH (ref 4.8–5.6)
eAG: 212 mg/dL

## 2020-11-15 LAB — CBC W/O DIFF
HCT: 41.7 % (ref 34.0–46.6)
HGB: 13.7 g/dL (ref 11.1–15.9)
MCH: 26.2 pg — ABNORMAL LOW (ref 26.6–33.0)
MCHC: 32.9 g/dL (ref 31.5–35.7)
MCV: 80 fL (ref 79–97)
PLATELET: 271 10*3/uL (ref 150–450)
RBC: 5.23 x10E6/uL (ref 3.77–5.28)
RDW: 13.8 % (ref 11.7–15.4)
WBC: 7.6 10*3/uL (ref 3.4–10.8)

## 2020-11-15 LAB — METABOLIC PANEL, COMPREHENSIVE
A-G Ratio: 1.5 (ref 1.2–2.2)
ALT (SGPT): 11 IU/L (ref 0–32)
AST (SGOT): 12 IU/L (ref 0–40)
Albumin: 4.3 g/dL (ref 3.8–4.8)
Alk. phosphatase: 52 IU/L (ref 44–121)
BUN/Creatinine ratio: 13 (ref 9–23)
BUN: 10 mg/dL (ref 6–24)
Bilirubin, total: 0.5 mg/dL (ref 0.0–1.2)
CO2: 26 mmol/L (ref 20–29)
Calcium: 9.6 mg/dL (ref 8.7–10.2)
Chloride: 97 mmol/L (ref 96–106)
Creatinine: 0.75 mg/dL (ref 0.57–1.00)
GFR est AA: 112 mL/min/{1.73_m2} (ref >59)
GFR est non-AA: 97 mL/min/{1.73_m2} (ref >59)
GLOBULIN, TOTAL: 2.9 g/dL (ref 1.5–4.5)
Glucose: 151 mg/dL — ABNORMAL HIGH (ref 65–99)
Potassium: 4 mmol/L (ref 3.5–5.2)
Protein, total: 7.2 g/dL (ref 6.0–8.5)
Sodium: 137 mmol/L (ref 134–144)

## 2020-11-15 LAB — HEMOGLOBIN A1C WITH EAG
Estimated average glucose: 212 mg/dL
Hemoglobin A1c: 9 % — ABNORMAL HIGH (ref 4.8–5.6)

## 2020-11-17 ENCOUNTER — Telehealth

## 2020-11-17 NOTE — Telephone Encounter (Signed)
pts pharmacy did not receive her januvia or ozempic pharmacy is [facility] on [address].

## 2020-11-18 MED ORDER — OZEMPIC 1 MG/DOSE (4 MG/3 ML) SUBCUTANEOUS PEN INJECTOR
1 mg/dose (4 mg/3 mL) | SUBCUTANEOUS | 1 refills | Status: DC
Start: 2020-11-18 — End: 2021-09-01

## 2020-11-18 MED ORDER — SITAGLIPTIN 100 MG TAB
100 mg | ORAL_TABLET | Freq: Every day | ORAL | 1 refills | Status: DC
Start: 2020-11-18 — End: 2021-12-18

## 2020-12-01 ENCOUNTER — Emergency Department (INDEPENDENT_AMBULATORY_CARE_PROVIDER_SITE_OTHER)
Admission: EM | Admit: 2020-12-01 | Discharge: 2020-12-01 | Disposition: A | Discharge status: Home / Self Care | Payer: Medicaid - Out of State | Source: Home / Self Care

## 2020-12-01 ENCOUNTER — Other Ambulatory Visit: Payer: Self-pay

## 2020-12-01 DIAGNOSIS — J320 Chronic maxillary sinusitis: Secondary | ICD-10-CM

## 2020-12-01 DIAGNOSIS — J029 Acute pharyngitis, unspecified: Secondary | ICD-10-CM

## 2020-12-01 LAB — POCT RAPID STREP A (OFFICE): Rapid Strep A Screen: NEGATIVE

## 2020-12-01 MED ORDER — AMOXICILLIN 500 MG PO CAPS: 500.0000 mg | ORAL_CAPSULE | Freq: Three times a day (TID) | ORAL | 0 refills | Status: DC

## 2020-12-01 NOTE — ED Provider Notes (Signed)
[facility]    CSN: [id number] Arrival date & time: 12/01/20  1838      History   Chief Complaint Chief Complaint  Patient presents with  . Sore Throat    HPI Cynthia Byrd is a 44 y.o. female.   Complains of sore throat for 1 week.  No fever.  She has had some productive cough with green sputum.  No loss of taste or smell.  She had Covid about 1 year ago and concerned that it may have recurred. HPI  Past Medical History:  Diagnosis Date  . Diabetes mellitus without complication (HCC)   . Headache   . Hypertension     There are no problems to display for this patient.   Past Surgical History:  Procedure Laterality Date  . DENTAL SURGERY    . FOOT SURGERY    . TUBAL LIGATION      OB History   No obstetric history on file.      Home Medications    Prior to Admission medications   Medication Sig Start Date End Date Taking? Authorizing Provider  Semaglutide (OZEMPIC, 0.25 OR 0.5 MG/DOSE, [place]) Inject into the skin.   Yes [provider]  amLODipine (NORVASC) 5 MG tablet Take 5 mg by mouth daily.    [provider]  aspirin-acetaminophen-caffeine (EXCEDRIN MIGRAINE) [phone] MG tablet Take by mouth every 6 (six) hours as needed for headache.    [provider]  benzonatate (TESSALON PERLES) 100 MG capsule Take 1 capsule (100 mg total) by mouth 3 (three) times daily as needed for cough. 11/10/18   Aviva Kluver B, PA-C  citalopram (CELEXA) 10 MG tablet Take 10 mg daily by mouth.    [provider]  glipiZIDE (GLUCOTROL) 10 MG tablet Take 10 mg by mouth 2 (two) times daily before a meal.    [provider]  guaiFENesin (MUCINEX) 600 MG 12 hr tablet Take 1 tablet (600 mg total) by mouth 2 (two) times daily. 11/10/18   Aviva Kluver B, PA-C  liraglutide (VICTOZA) 18 MG/3ML SOPN Inject into the skin.    [provider]  metFORMIN (GLUCOPHAGE) 1000 MG tablet Take 1,000 mg by mouth 2 (two) times daily with a  meal.    [provider]  oseltamivir (TAMIFLU) 75 MG capsule Take 1 capsule (75 mg total) by mouth every 12 (twelve) hours. 01/15/17   Lurene Shadow, PA-C  phenazopyridine (PYRIDIUM) 200 MG tablet Take 1 tablet (200 mg total) by mouth 3 (three) times daily. 03/21/20   Pollyann Savoy, MD  Rizatriptan Benzoate (MAXALT PO) Take by mouth.    [provider]  Topiramate (TOPAMAX PO) Take by mouth.    [provider]    Family History Family History  Problem Relation Age of Onset  . Hypertension Mother   . Healthy Father     Social History Social History   Tobacco Use  . Smoking status: Never Smoker  . Smokeless tobacco: Never Used  Vaping Use  . Vaping Use: Never used  Substance Use Topics  . Alcohol use: Yes    Comment: occ  . Drug use: No     Allergies   Codeine and Percocet [oxycodone-acetaminophen]   Review of Systems Review of Systems  HENT: Positive for sore throat.   Respiratory: Positive for cough.   All other systems reviewed and are negative.    Physical Exam Triage Vital Signs ED Triage Vitals  Enc Vitals Group  BP 12/01/20 1854 126/82     Pulse Rate 12/01/20 1854 87     Resp 12/01/20 1854 14     Temp 12/01/20 1854 98.2 F (36.8 C)     Temp Source 12/01/20 1854 Oral     SpO2 12/01/20 1854 99 %     Weight --      Height --      Head Circumference --      Peak Flow --      Pain Score 12/01/20 1851 4     Pain Loc --      Pain Edu? --      Excl. in GC? --    No data found.  Updated Vital Signs BP 126/82 (BP Location: Right Arm)   Pulse 87   Temp 98.2 F (36.8 C) (Oral)   Resp 14   SpO2 99%   Visual Acuity Right Eye Distance:   Left Eye Distance:   Bilateral Distance:    Right Eye Near:   Left Eye Near:    Bilateral Near:     Physical Exam Vitals and nursing note reviewed.  Constitutional:      Appearance: She is well-developed.  HENT:     Head:     Comments: Maxillary sinus tenderness with  percussion    Mouth/Throat:     Mouth: Mucous membranes are moist. Mucous membranes are pale.     Pharynx: Oropharynx is clear.  Cardiovascular:     Rate and Rhythm: Normal rate and regular rhythm.  Pulmonary:     Effort: Pulmonary effort is normal.     Breath sounds: Normal breath sounds.  Neurological:     Mental Status: She is alert.      UC Treatments / Results  Labs (all labs ordered are listed, but only abnormal results are displayed) Labs Reviewed  POCT RAPID STREP A (OFFICE)    EKG   Radiology No results found.  Procedures Procedures (including critical care time)  Medications Ordered in UC Medications - No data to display  Initial Impression / Assessment and Plan / UC Course  I have reviewed the triage vital signs and the nursing notes.  Pertinent labs & imaging results that were available during my care of the patient were reviewed by me and considered in my medical decision making (see chart for details).     Pharyngitis probably related to postnasal drainage.  Will check for Covid and also treat for maxillary sinusitis Final Clinical Impressions(s) / UC Diagnoses   Final diagnoses:  None   Discharge Instructions   None    ED Prescriptions    None     PDMP not reviewed this encounter.   Frederica Kuster, MD 12/01/20 [phone]

## 2020-12-01 NOTE — ED Triage Notes (Signed)
Patient presents to Urgent Care with complaints of sore throat since 7 days ago. Patient reports she thinks she has covid again, last had it last year around this time. Pt has not been vaccinated for covid.

## 2020-12-02 LAB — STREP A DNA PROBE: Group A Strep Probe: NOT DETECTED

## 2020-12-03 LAB — SARS-COV-2 RNA,(COVID-19) QUALITATIVE NAAT: SARS CoV2 RNA: NOT DETECTED

## 2020-12-16 ENCOUNTER — Encounter (HOSPITAL_BASED_OUTPATIENT_CLINIC_OR_DEPARTMENT_OTHER): Payer: Self-pay

## 2020-12-16 ENCOUNTER — Other Ambulatory Visit: Payer: Self-pay

## 2020-12-16 ENCOUNTER — Emergency Department (HOSPITAL_BASED_OUTPATIENT_CLINIC_OR_DEPARTMENT_OTHER)
Admission: EM | Admit: 2020-12-16 | Discharge: 2020-12-16 | Disposition: A | Discharge status: Home / Self Care | Payer: Medicaid Other | Attending: Emergency Medicine | Admitting: Emergency Medicine

## 2020-12-16 DIAGNOSIS — R059 Cough, unspecified: Secondary | ICD-10-CM | POA: Insufficient documentation

## 2020-12-16 DIAGNOSIS — R5383 Other fatigue: Secondary | ICD-10-CM | POA: Diagnosis not present

## 2020-12-16 DIAGNOSIS — J020 Streptococcal pharyngitis: Secondary | ICD-10-CM | POA: Diagnosis not present

## 2020-12-16 DIAGNOSIS — J029 Acute pharyngitis, unspecified: Secondary | ICD-10-CM | POA: Diagnosis not present

## 2020-12-16 NOTE — ED Triage Notes (Signed)
Sore throat, cough, chills, fatigue.  Recent contact positive for strep today.

## 2020-12-19 ENCOUNTER — Other Ambulatory Visit: Payer: Self-pay

## 2020-12-19 ENCOUNTER — Emergency Department (HOSPITAL_BASED_OUTPATIENT_CLINIC_OR_DEPARTMENT_OTHER)
Admission: EM | Admit: 2020-12-19 | Discharge: 2020-12-19 | Disposition: A | Discharge status: Home / Self Care | Payer: Medicaid Other | Attending: Emergency Medicine | Admitting: Emergency Medicine

## 2020-12-19 ENCOUNTER — Encounter (HOSPITAL_BASED_OUTPATIENT_CLINIC_OR_DEPARTMENT_OTHER): Payer: Self-pay | Admitting: Emergency Medicine

## 2020-12-19 DIAGNOSIS — R070 Pain in throat: Secondary | ICD-10-CM | POA: Insufficient documentation

## 2020-12-19 DIAGNOSIS — Z5321 Procedure and treatment not carried out due to patient leaving prior to being seen by health care provider: Secondary | ICD-10-CM | POA: Insufficient documentation

## 2020-12-19 DIAGNOSIS — R5383 Other fatigue: Secondary | ICD-10-CM | POA: Diagnosis not present

## 2020-12-19 DIAGNOSIS — R6883 Chills (without fever): Secondary | ICD-10-CM | POA: Diagnosis not present

## 2020-12-19 DIAGNOSIS — R059 Cough, unspecified: Secondary | ICD-10-CM | POA: Diagnosis not present

## 2020-12-19 NOTE — ED Triage Notes (Signed)
Pt states she has a sore throat, cough, chills, and fatigue  Pt states she has been sick off and on for the past month  Pt states she was here on Wednesday but did not stay

## 2020-12-19 NOTE — ED Notes (Signed)
Pt didn't want to wait further for evaluation, vitals previously WNL. LWBS

## 2021-03-20 ENCOUNTER — Ambulatory Visit: Admit: 2021-03-20 | Discharge: 2021-03-20 | Payer: MEDICAID | Attending: Family Medicine | Primary: Family Medicine

## 2021-03-20 ENCOUNTER — Ambulatory Visit: Attending: Family Medicine | Primary: Family Medicine

## 2021-03-20 DIAGNOSIS — E1165 Type 2 diabetes mellitus with hyperglycemia: Secondary | ICD-10-CM

## 2021-03-20 NOTE — Progress Notes (Signed)
HPI     Chief Complaint:   Chief Complaint   Patient presents with   ??? Follow-up     FS BS under 200's        HPI:  Sandy King is a 45 y.o. female with a history of Uncontrolled T2DM, HTN, Dyslipidemia, GAD, Obesity.    Uncontrolled T2DM: Has not consistently been checking blood sugar. No polyuria or polydipsia.  Lab Results   Component Value Date/Time    Hemoglobin A1c 9.0 (H) 11/14/2020 02:06 PM    Hemoglobin A1c, External 12.3 12/08/2018 12:00 AM      Diabetes Checklist  ACE inhibitor: taking  Last Lipid panel: LLP abnormal  Statin: taking  Last Microalbumin: UTD  Pneumonia vaccine 19-64?: declines  Last seen by opthalmology: has upcoming appt.    HTN: Compliant with medications. No cough or chest pain.     HLD: Compliant with statin, no myalgias.    Lab Results   Component Value Date/Time    Cholesterol, total 186 11/14/2020 02:06 PM    HDL Cholesterol 41 11/14/2020 02:06 PM    LDL, calculated 126 (H) 11/14/2020 02:06 PM    VLDL, calculated 19 11/14/2020 02:06 PM    Triglyceride 104 11/14/2020 02:06 PM      GAD: controlled on celexa 40mg  daily. No HI/SI.    Review of Systems   Constitutional: Negative for chills and fever.   Respiratory: Negative for cough and shortness of breath.    Cardiovascular: Negative for chest pain and palpitations.   Psychiatric/Behavioral: Negative for hallucinations and substance abuse.       Reviewed PmHx, FmHx, SocHx as well as meds and allergies, updated and dated in the chart.    Past Medical History:   Diagnosis Date   ??? Acne vulgaris    ??? Anxiety    ??? Asthma    ??? Chlamydia    ??? Chondromalacia patellae of right knee    ??? Depression    ??? Diabetes (HCC)    ??? Headache(784.0)    ??? Hypertension    ??? Keloid of skin    ??? Migraines      Social History     Tobacco Use   ??? Smoking status: Never Smoker   ??? Smokeless tobacco: Never Used   Vaping Use   ??? Vaping Use: Never used   Substance Use Topics   ??? Alcohol use: Yes     Comment: social   ??? Drug use: No       Current Outpatient  Medications on File Prior to Visit   Medication Sig Dispense Refill   ??? SITagliptin (JANUVIA) 100 mg tablet Take 1 Tablet by mouth daily. 90 Tablet 1   ??? Ozempic 1 mg/dose (4 mg/3 mL) pnij 1 mg by SubCUTAneous route every seven (7) days. 12 Each 1   ??? rosuvastatin (CRESTOR) 5 mg tablet Take 1 Tablet by mouth nightly. 90 Tablet 1   ??? metFORMIN ER (GLUCOPHAGE XR) 500 mg tablet Take 1 Tablet by mouth two (2) times a day. 180 Tablet 1   ??? lisinopriL (PRINIVIL, ZESTRIL) 10 mg tablet Take 1 Tablet by mouth daily. 90 Tablet 1   ??? citalopram (CELEXA) 40 mg tablet TAKE 1 TABLET BY MOUTH ONCE DAILY FOR 90 DAYS 90 Tablet 1   ??? amLODIPine (NORVASC) 10 mg tablet Take 1 Tablet by mouth daily. 90 Tablet 1   ??? dapagliflozin (Farxiga) 5 mg tab tablet Take 1 Tablet by mouth daily. 90 Tablet 1   ???  Blood Sugar Diagnostic, Drum (Accu-Chek Compact Plus Test) strp by Does Not Apply route.     ??? Insulin Needles, Disposable, (BD Ultra-Fine Short Pen Needle) 31 gauge x 5/16" ndle by Does Not Apply route.     ??? glucose blood VI test strips (TRUE METRIX GLUCOSE TEST STRIP) strip Test BID Dx Code: E11.65 200 Strip 3   ??? lancets (TRUEPLUS LANCETS) 33 gauge misc Test BID Dx Code: E11.65 200 Lancet 3     No current facility-administered medications on file prior to visit.       Allergies   Allergen Reactions   ??? Codeine Other (comments)     Dizziness/delirium   ??? Metformin Diarrhea   ??? Percocet [Oxycodone-Acetaminophen] Other (comments)     dizziness          Objective     Visit Vitals  BP 130/89 (BP 1 Location: Left upper arm, BP Patient Position: Sitting, BP Cuff Size: Large adult)   Pulse 89   Temp 97.4 ??F (36.3 ??C) (Temporal)   Resp 20   Ht 5\' 8"  (1.727 m)   Wt 269 lb 6.4 oz (122.2 kg)   BMI 40.96 kg/m??     Physical Exam  Vitals and nursing note reviewed.   Constitutional:       Appearance: Normal appearance.   Cardiovascular:      Rate and Rhythm: Normal rate and regular rhythm.      Heart sounds: Normal heart sounds.   Pulmonary:       Effort: Pulmonary effort is normal. No respiratory distress.      Breath sounds: Normal breath sounds.   Neurological:      General: No focal deficit present.      Mental Status: She is alert and oriented to person, place, and time.   Psychiatric:         Mood and Affect: Mood normal.         Behavior: Behavior normal.          Assessment and Plan     Diagnoses and all orders for this visit:    1. Type 2 diabetes mellitus with hyperglycemia, unspecified whether long term insulin use (HCC)  Comments:  Hopeful for better control. Checking A1c.  Orders:  -     CBC W/O DIFF  -     HEMOGLOBIN A1C WITH EAG    2. Essential (primary) hypertension  Comments:  At goal. Continue current regimen.  Orders:  -     METABOLIC PANEL, COMPREHENSIVE    3. Mixed hyperlipidemia  Comments:  Checking labs, patient is fasting. Increase statin if LDL still elevated.  Orders:  -     CBC W/O DIFF  -     LIPID PANEL  -     METABOLIC PANEL, COMPREHENSIVE    4. Generalized anxiety disorder  Comments:  Stable on Celexa. Continue.    5. Need for hepatitis C screening test  -     HEPATITIS C AB        As applicable:  Medication Side Effects and Warnings were discussed with patient, as indicated.  Patient Labs were reviewed and or requested, as indicated.  Patient Past Records were reviewed and or requested, as indicated.    Follow-up and Dispositions    ?? Return in about 3 months (around 06/19/2021) for annual physical.            Tren??e 06/21/2021, MD  [address] [facility]  [address]  [facility]

## 2021-03-20 NOTE — Progress Notes (Signed)
1. "Have you been to the ER, urgent care clinic since your last visit?  Hospitalized since your last visit?" No    2. "Have you seen or consulted any other health care providers outside of the [facility] since your last visit?" No     3. For patients aged 44-75: Has the patient had a colonoscopy / FIT/ Cologuard? NA - based on age      If the patient is female:    4. For patients aged 14-74: Has the patient had a mammogram within the past 2 years? N/A      5. For patients aged 21-65: Has the patient had a pap smear? No  Visit Vitals  BP 130/89 (BP 1 Location: Left upper arm, BP Patient Position: Sitting, BP Cuff Size: Large adult)   Pulse 89   Temp 97.4 F (36.3 C) (Temporal)   Resp 20   Ht 5\' 8"  (1.727 m)   Wt 269 lb 6.4 oz (122.2 kg)   BMI 40.96 kg/m     Chief Complaint   Patient presents with   . Follow-up     FS BS under 200's

## 2021-03-20 NOTE — Progress Notes (Signed)
Called patient. ID confirmed x2.  Diabetes still very uncontrolled. Long discussion with patient. +Lantus 10units nightly. Titrate by 2units every 3 days until fasting glucose less than 140. Warning signs reviewed with patient.   Hold farxiga and ozempic.  Cholesterol  panel shows LDL still elevated. Increase rosuvastatin 20mg  nightly.   Kidney/liver function and electrolytes normal.

## 2021-03-21 LAB — COMPREHENSIVE METABOLIC PANEL
ALT: 10 IU/L (ref 0–32)
AST: 13 IU/L (ref 0–40)
Albumin/Globulin Ratio: 1.5 NA (ref 1.2–2.2)
Albumin: 4.4 g/dL (ref 3.8–4.8)
Alkaline Phosphatase: 49 IU/L (ref 44–121)
BUN: 11 mg/dL (ref 6–24)
Bun/Cre Ratio: 13 NA (ref 9–23)
CO2: 23 mmol/L (ref 20–29)
Calcium: 9.7 mg/dL (ref 8.7–10.2)
Chloride: 100 mmol/L (ref 96–106)
Creatinine: 0.82 mg/dL (ref 0.57–1.00)
Est, Glomerular Filtration Rate: 90 mL/min/{1.73_m2} (ref >59)
Globulin, Total: 3 g/dL (ref 1.5–4.5)
Glucose: 138 mg/dL — ABNORMAL HIGH (ref 65–99)
Potassium: 4.5 mmol/L (ref 3.5–5.2)
Sodium: 139 mmol/L (ref 134–144)
Total Bilirubin: 0.6 mg/dL (ref 0.0–1.2)
Total Protein: 7.4 g/dL (ref 6.0–8.5)

## 2021-03-21 LAB — LIPID PANEL
Cholesterol, Total: 154 mg/dL (ref 100–199)
Cholesterol, total: 154 mg/dL (ref 100–199)
HDL Cholesterol: 39 mg/dL — ABNORMAL LOW (ref >39)
HDL: 39 mg/dL — ABNORMAL LOW (ref >39)
LDL Calculated: 97 mg/dL (ref 0–99)
LDL, calculated: 97 mg/dL (ref 0–99)
Triglyceride: 94 mg/dL (ref 0–149)
Triglycerides: 94 mg/dL (ref 0–149)
VLDL, calculated: 18 mg/dL (ref 5–40)
VLDL: 18 mg/dL (ref 5–40)

## 2021-03-21 LAB — CBC
Hematocrit: 44.3 % (ref 34.0–46.6)
Hemoglobin: 14 g/dL (ref 11.1–15.9)
MCH: 25.5 pg — ABNORMAL LOW (ref 26.6–33.0)
MCHC: 31.6 g/dL (ref 31.5–35.7)
MCV: 81 fL (ref 79–97)
Platelets: 251 10*3/uL (ref 150–450)
RBC: 5.5 x10E6/uL — ABNORMAL HIGH (ref 3.77–5.28)
RDW: 14.5 % (ref 11.7–15.4)
WBC: 8 10*3/uL (ref 3.4–10.8)

## 2021-03-21 LAB — HEMOGLOBIN A1C W/EAG
Hemoglobin A1C: 8.7 % — ABNORMAL HIGH (ref 4.8–5.6)
eAG: 203 mg/dL

## 2021-03-21 LAB — HEPATITIS C ANTIBODY: HCV Ab: <0.1 s/co ratio (ref 0.0–0.9)

## 2021-03-21 LAB — METABOLIC PANEL, COMPREHENSIVE
A-G Ratio: 1.5 (ref 1.2–2.2)
ALT (SGPT): 10 IU/L (ref 0–32)
AST (SGOT): 13 IU/L (ref 0–40)
Albumin: 4.4 g/dL (ref 3.8–4.8)
Alk. phosphatase: 49 IU/L (ref 44–121)
BUN/Creatinine ratio: 13 (ref 9–23)
BUN: 11 mg/dL (ref 6–24)
Bilirubin, total: 0.6 mg/dL (ref 0.0–1.2)
CO2: 23 mmol/L (ref 20–29)
Calcium: 9.7 mg/dL (ref 8.7–10.2)
Chloride: 100 mmol/L (ref 96–106)
Creatinine: 0.82 mg/dL (ref 0.57–1.00)
GLOBULIN, TOTAL: 3 g/dL (ref 1.5–4.5)
Glucose: 138 mg/dL — ABNORMAL HIGH (ref 65–99)
Potassium: 4.5 mmol/L (ref 3.5–5.2)
Protein, total: 7.4 g/dL (ref 6.0–8.5)
Sodium: 139 mmol/L (ref 134–144)
eGFR: 90 mL/min/{1.73_m2} (ref >59)

## 2021-03-21 LAB — CBC W/O DIFF
HCT: 44.3 % (ref 34.0–46.6)
HGB: 14 g/dL (ref 11.1–15.9)
MCH: 25.5 pg — ABNORMAL LOW (ref 26.6–33.0)
MCHC: 31.6 g/dL (ref 31.5–35.7)
MCV: 81 fL (ref 79–97)
PLATELET: 251 10*3/uL (ref 150–450)
RBC: 5.5 x10E6/uL — ABNORMAL HIGH (ref 3.77–5.28)
RDW: 14.5 % (ref 11.7–15.4)
WBC: 8 10*3/uL (ref 3.4–10.8)

## 2021-03-21 LAB — HEMOGLOBIN A1C WITH EAG
Estimated average glucose: 203 mg/dL
Hemoglobin A1c: 8.7 % — ABNORMAL HIGH (ref 4.8–5.6)

## 2021-03-21 LAB — HEPATITIS C AB: Hep C Virus Ab: <0.1 s/co ratio (ref 0.0–0.9)

## 2021-03-26 ENCOUNTER — Encounter

## 2021-03-26 MED ORDER — ROSUVASTATIN 20 MG TAB
20 mg | ORAL_TABLET | Freq: Every evening | ORAL | 1 refills | Status: DC
Start: 2021-03-26 — End: 2021-09-10

## 2021-03-26 MED ORDER — INSULIN GLARGINE 100 UNIT/ML (3 ML) SUB-Q PEN
100 unit/mL (3 mL) | Freq: Every evening | SUBCUTANEOUS | 1 refills | Status: DC
Start: 2021-03-26 — End: 2021-09-01

## 2021-04-17 ENCOUNTER — Encounter: Payer: MEDICAID | Attending: Family Medicine | Primary: Family Medicine

## 2021-06-26 ENCOUNTER — Encounter: Attending: Family Medicine | Primary: Family Medicine

## 2021-06-26 NOTE — Telephone Encounter (Signed)
Pt was scheduled with Dr. Shelva Majestic, pt did not answer the phone so voicemail was left.

## 2021-07-21 ENCOUNTER — Emergency Department (HOSPITAL_BASED_OUTPATIENT_CLINIC_OR_DEPARTMENT_OTHER)
Admission: EM | Admit: 2021-07-21 | Discharge: 2021-07-21 | Discharge status: Against Medical Advice | Payer: Medicaid Other | Attending: Emergency Medicine | Admitting: Emergency Medicine

## 2021-07-21 ENCOUNTER — Encounter (HOSPITAL_BASED_OUTPATIENT_CLINIC_OR_DEPARTMENT_OTHER): Payer: Self-pay

## 2021-07-21 ENCOUNTER — Other Ambulatory Visit: Payer: Self-pay

## 2021-07-21 DIAGNOSIS — R519 Headache, unspecified: Secondary | ICD-10-CM | POA: Diagnosis not present

## 2021-07-21 DIAGNOSIS — E119 Type 2 diabetes mellitus without complications: Secondary | ICD-10-CM | POA: Insufficient documentation

## 2021-07-21 DIAGNOSIS — Z79899 Other long term (current) drug therapy: Secondary | ICD-10-CM | POA: Diagnosis not present

## 2021-07-21 DIAGNOSIS — Z7984 Long term (current) use of oral hypoglycemic drugs: Secondary | ICD-10-CM | POA: Diagnosis not present

## 2021-07-21 DIAGNOSIS — I10 Essential (primary) hypertension: Secondary | ICD-10-CM | POA: Diagnosis not present

## 2021-07-21 LAB — PREGNANCY, URINE: Preg Test, Ur: NEGATIVE

## 2021-07-21 MED ORDER — KETOROLAC TROMETHAMINE 15 MG/ML IJ SOLN: 15.0000 mg | Freq: Once | INTRAMUSCULAR | Status: DC

## 2021-07-21 MED ORDER — SODIUM CHLORIDE 0.9 % IV BOLUS
1000.0000 mL | Freq: Once | INTRAVENOUS | Status: AC
  Administered 2021-07-21: 1000 mL via INTRAVENOUS

## 2021-07-21 MED ORDER — PROCHLORPERAZINE EDISYLATE 10 MG/2ML IJ SOLN
10.0000 mg | Freq: Once | INTRAMUSCULAR | Status: AC
  Administered 2021-07-21: 10 mg via INTRAVENOUS
  Filled 2021-07-21: qty 2

## 2021-07-21 MED ORDER — DIPHENHYDRAMINE HCL 50 MG/ML IJ SOLN
12.5000 mg | Freq: Once | INTRAMUSCULAR | Status: AC
  Administered 2021-07-21: 12.5 mg via INTRAVENOUS
  Filled 2021-07-21: qty 1

## 2021-07-21 NOTE — ED Triage Notes (Signed)
Hx of migraine, this headache began Saturday AM, Excedrin ~1 hr ago & peppermint oil without relief. Right orbital pain that radiates to neck. States it feels like previous episodes.

## 2021-07-21 NOTE — ED Notes (Signed)
Pt reported had a family emergency and had to leave. IV removed. Pt left prior to Signing AMA form. nad noted. Pt ambulated out of department with steady gait. Airway patent. Consulting civil engineer and primary RN aware.

## 2021-07-21 NOTE — ED Provider Notes (Signed)
[facility] EMERGENCY DEPARTMENT Provider Note   CSN: [id number] Arrival date & time: 07/21/21  1028     History Chief Complaint  Patient presents with   Headache    Cynthia Byrd is a 45 y.o. female with a past medical history significant for diabetes, history of migraines, and hypertension who presents to the ED due to persistent headache x3 days.  Headache located in the right frontal region.  Headache associated with photophobia.  Denies visual changes, speech changes, unilateral weakness, and dizziness.  Patient states it feels similar to her past migraines.  No recent head injury.  No fever or chills.  No neck stiffness.  She has been taking Excedrin with no relief in symptoms.  She is not currently on any prophylaxis medication for migraines.  She also endorses intermittent nausea.  No emesis.  Denies worst headache of her life.  Denies worst intensity at onset.   History obtained from patient and past medical records. No interpreter used during encounter.      Past Medical History:  Diagnosis Date   Diabetes mellitus without complication (HCC)    Headache    Hypertension     There are no problems to display for this patient.   Past Surgical History:  Procedure Laterality Date   DENTAL SURGERY     FOOT SURGERY     TUBAL LIGATION       OB History   No obstetric history on file.     Family History  Problem Relation Age of Onset   Hypertension Mother    Healthy Father     Social History   Tobacco Use   Smoking status: Never   Smokeless tobacco: Never  Vaping Use   Vaping Use: Never used  Substance Use Topics   Alcohol use: Yes    Comment: occ   Drug use: No    Home Medications Prior to Admission medications   Medication Sig Start Date End Date Taking? Authorizing Provider  amLODipine (NORVASC) 5 MG tablet Take 5 mg by mouth daily.    [provider]  amoxicillin (AMOXIL) 500 MG capsule Take 1 capsule (500 mg total) by mouth 3  (three) times daily. 12/01/20   Frederica Kuster, MD  aspirin-acetaminophen-caffeine (EXCEDRIN MIGRAINE) [phone] MG tablet Take by mouth every 6 (six) hours as needed for headache.    [provider]  benzonatate (TESSALON PERLES) 100 MG capsule Take 1 capsule (100 mg total) by mouth 3 (three) times daily as needed for cough. 11/10/18   Aviva Kluver B, PA-C  citalopram (CELEXA) 10 MG tablet Take 10 mg daily by mouth.    [provider]  glipiZIDE (GLUCOTROL) 10 MG tablet Take 10 mg by mouth 2 (two) times daily before a meal.    [provider]  guaiFENesin (MUCINEX) 600 MG 12 hr tablet Take 1 tablet (600 mg total) by mouth 2 (two) times daily. 11/10/18   Aviva Kluver B, PA-C  liraglutide (VICTOZA) 18 MG/3ML SOPN Inject into the skin.    [provider]  metFORMIN (GLUCOPHAGE) 1000 MG tablet Take 1,000 mg by mouth 2 (two) times daily with a meal.    [provider]  oseltamivir (TAMIFLU) 75 MG capsule Take 1 capsule (75 mg total) by mouth every 12 (twelve) hours. 01/15/17   Lurene Shadow, PA-C  phenazopyridine (PYRIDIUM) 200 MG tablet Take 1 tablet (200 mg total) by mouth 3 (three) times daily. 03/21/20   Pollyann Savoy, MD  Rizatriptan Benzoate (  MAXALT PO) Take by mouth.    [provider]  Semaglutide (OZEMPIC, 0.25 OR 0.5 MG/DOSE, [place]) Inject into the skin.    [provider]  Topiramate (TOPAMAX PO) Take by mouth.    [provider]    Allergies    Codeine and Percocet [oxycodone-acetaminophen]  Review of Systems   Review of Systems  Constitutional:  Negative for chills and fever.  Eyes:  Positive for photophobia. Negative for visual disturbance.  Gastrointestinal:  Positive for nausea. Negative for vomiting.  Musculoskeletal:  Negative for neck stiffness.  Neurological:  Positive for headaches. Negative for dizziness, speech difficulty, weakness and numbness.  All other systems reviewed and are  negative.  Physical Exam Updated Vital Signs BP (!) 151/91 (BP Location: Right Arm)   Pulse 82   Temp 98.5 F (36.9 C) (Oral)   Resp 15   Ht 5\' 8"  (1.727 m)   Wt 122.5 kg   SpO2 100%   BMI 41.05 kg/m   Physical Exam Vitals and nursing note reviewed.  Constitutional:      General: She is not in acute distress.    Appearance: She is not ill-appearing.  HENT:     Head: Normocephalic.  Eyes:     Pupils: Pupils are equal, round, and reactive to light.  Cardiovascular:     Rate and Rhythm: Normal rate and regular rhythm.     Pulses: Normal pulses.     Heart sounds: Normal heart sounds. No murmur heard.   No friction rub. No gallop.  Pulmonary:     Effort: Pulmonary effort is normal.     Breath sounds: Normal breath sounds.  Abdominal:     General: Abdomen is flat. There is no distension.     Palpations: Abdomen is soft.     Tenderness: There is no abdominal tenderness. There is no guarding or rebound.  Musculoskeletal:        General: Normal range of motion.     Cervical back: Neck supple.  Skin:    General: Skin is warm and dry.  Neurological:     General: No focal deficit present.     Mental Status: She is alert.     Comments: Speech is clear, able to follow commands CN III-XII intact Normal strength in upper and lower extremities bilaterally including dorsiflexion and plantar flexion, strong and equal grip strength Sensation grossly intact throughout Moves extremities without ataxia, coordination intact No pronator drift Ambulates without difficulty  Psychiatric:        Mood and Affect: Mood normal.        Behavior: Behavior normal.    ED Results / Procedures / Treatments   Labs (all labs ordered are listed, but only abnormal results are displayed) Labs Reviewed  PREGNANCY, URINE    EKG None  Radiology No results found.  Procedures Procedures   Medications Ordered in ED Medications  ketorolac (TORADOL) 15 MG/ML injection 15 mg (has no  administration in time range)  prochlorperazine (COMPAZINE) injection 10 mg (10 mg Intravenous Given 07/21/21 1153)  diphenhydrAMINE (BENADRYL) injection 12.5 mg (12.5 mg Intravenous Given 07/21/21 1153)  sodium chloride 0.9 % bolus 1,000 mL (0 mLs Intravenous Stopped 07/21/21 1230)    ED Course  I have reviewed the triage vital signs and the nursing notes.  Pertinent labs & imaging results that were available during my care of the patient were reviewed by me and considered in my medical decision making (see chart for details).    MDM Rules/Calculators/A&P  45 year old female presents to the ED due to right-sided headache x3 days.  Patient has a history of migraines and states it feels similar to her past migraines.  She has been taking Excedrin with no relief.  No fever or chills.  Upon arrival, stable vitals.  Patient is afebrile, not tachycardic or hypoxic.  Patient nontoxic-appearing.  Physical exam reassuring.  Normal neurological exam.  Low suspicion for emergent intracranial etiologies.  No meningismus to suggest meningitis. Will give migraine cocktail and reassess.   12:39 PM informed by RN that patient had to leave due to family emergency.  Patient left AMA.  Unable to speak to patient prior to leaving.  Final Clinical Impression(s) / ED Diagnoses Final diagnoses:  Acute nonintractable headache, unspecified headache type    Rx / DC Orders ED Discharge Orders     None        Jesusita Oka 07/21/21 1240    Benjiman Core, MD 07/23/21 [phone]

## 2021-09-01 ENCOUNTER — Telehealth: Admit: 2021-09-01 | Discharge: 2021-09-01 | Payer: MEDICAID | Attending: Family Medicine | Primary: Family Medicine

## 2021-09-01 ENCOUNTER — Telehealth: Attending: Family Medicine | Primary: Family Medicine

## 2021-09-01 DIAGNOSIS — E1165 Type 2 diabetes mellitus with hyperglycemia: Secondary | ICD-10-CM

## 2021-09-01 MED ORDER — TRUE METRIX GLUCOSE TEST STRIP
ORAL_STRIP | 3 refills | Status: AC
Start: 2021-09-01 — End: ?

## 2021-09-01 MED ORDER — INSULIN GLARGINE 100 UNIT/ML (3 ML) SUB-Q PEN
100 unit/mL (3 mL) | PEN_INJECTOR | Freq: Every evening | SUBCUTANEOUS | 2 refills | Status: DC
Start: 2021-09-01 — End: 2021-10-23

## 2021-09-01 MED ORDER — METFORMIN SR 500 MG 24 HR TABLET
500 mg | ORAL_TABLET | Freq: Two times a day (BID) | ORAL | 1 refills | Status: DC
Start: 2021-09-01 — End: 2021-12-18

## 2021-09-01 MED ORDER — OZEMPIC 1 MG/DOSE (4 MG/3 ML) SUBCUTANEOUS PEN INJECTOR
1 mg/dose (4 mg/3 mL) | SUBCUTANEOUS | 1 refills | Status: AC
Start: 2021-09-01 — End: ?

## 2021-09-01 NOTE — Progress Notes (Signed)
HPI     Chief Complaint   Patient presents with    Medication Question     Question regarding Insulin        HPI:  Sandy King is a 45 y.o. female with a history of Uncontrolled T2DM, HTN, Dyslipidemia, GAD, Obesity.    T2DM: "My numbers are not looking good at all".  Patient has not been seen since April 2022. Has not checked glucose readings, says she got discouraged. Last A1c was in 2021.   Patient takes metformin 1000mg  XR once a day, januvia 100mg  daily, and lantus 32u nightly (she titrated up from 10u nightly). She is no longer taking farxiga or ozempic.     Lab Results   Component Value Date/Time    Hemoglobin A1c 8.7 (H) 03/20/2021 11:42 AM    Hemoglobin A1c, External 12.3 12/08/2018 12:00 AM    ACE inhibitor: taking  Last Lipid panel: LLP abnormal, due  Statin: taking  Last Microalbumin: present  Pneumonia vaccine 19-64?: declined previously  Last seen by opthalmology:overdue, did  not make appt previously    HTN: compliant with medications. No dizziness.    HLD: compliant with statin.  Lab Results   Component Value Date/Time    Cholesterol, total 154 03/20/2021 11:42 AM    HDL Cholesterol 39 (L) 03/20/2021 11:42 AM    LDL, calculated 97 03/20/2021 11:42 AM    VLDL, calculated 18 03/20/2021 11:42 AM    Triglyceride 94 03/20/2021 11:42 AM        Obesity: has gained 20 pounds on insulin. She is not on ozempic.  Weight Metrics 03/20/2021 11/14/2020 04/23/2020 07/17/2019 07/31/2018 10/05/2012   Weight 269 lb 6.4 oz 269 lb 6.4 oz 273 lb 284 lb 275 lb 288 lb   BMI 40.96 kg/m2 40.96 kg/m2 42.13 kg/m2 43.18 kg/m2 41.81 kg/m2 43.8 kg/m2       Review of Systems   Cardiovascular:  Negative for chest pain and palpitations.   Neurological:  Negative for dizziness and headaches.   Endo/Heme/Allergies:  Negative for polydipsia. Does not bruise/bleed easily.     Reviewed PmHx, FmHx, SocHx as well as meds and allergies, updated and dated in the chart.       Objective     Vital Signs: (As obtained by patient/caregiver  at home)  There were no vitals taken for this visit.   Patient-Reported Vitals 07/24/2020   Patient-Reported Weight 262 lb       [INSTRUCTIONS:  "[x] " Indicates a positive item  "[] " Indicates a negative item  -- DELETE ALL ITEMS NOT EXAMINED]    Constitutional: [x]  Appears well-developed and well-nourished [x]  No apparent distress      []  Abnormal -     Mental status: [x]  Alert and awake  [x]  Oriented to person/place/time [x]  Able to follow commands    []  Abnormal -     Eyes:   EOM    [x]   Normal    []  Abnormal -   Sclera  [x]   Normal    []  Abnormal -          Discharge [x]   None visible   []  Abnormal -     HENT: [x]  Normocephalic, atraumatic  []  Abnormal -   [x]  Mouth/Throat: Mucous membranes are moist    External Ears [x]  Normal  []  Abnormal -    Neck: [x]  No visualized mass []  Abnormal -     Pulmonary/Chest: [x]  Respiratory effort normal   [x]  No visualized signs of difficulty breathing  or respiratory distress        []  Abnormal -      Musculoskeletal:   [x]  Normal gait with no signs of ataxia         [x]  Normal range of motion of neck        []  Abnormal -     Neurological:        [x]  No Facial Asymmetry (Cranial nerve 7 motor function) (limited exam due to video visit)          [x]  No gaze palsy        []  Abnormal -          Skin:        [x]  No significant exanthematous lesions or discoloration noted on facial skin         []  Abnormal -            Psychiatric:       [x]  Normal Affect []  Abnormal -        [x]  No Hallucinations    Other pertinent observable physical exam findings:-          Assessment and Plan     Diagnoses and all orders for this visit:    1. Type 2 diabetes mellitus with hyperglycemia, with long-term current use of insulin (HCC)  Comments:  Patient needs to check glucose consistently. Increasing metformin-->1000mg  BID and restarting ozempic. Will adjust insulin based on A1c results.  Orders:  -     HEMOGLOBIN A1C WITH EAG; Future  -     LIPID PANEL; Future  -     CBC WITH AUTOMATED DIFF;  Future  -     METABOLIC PANEL, COMPREHENSIVE; Future  -     MICROALBUMIN, UR, RAND W/ MICROALB/CREAT RATIO; Future  -     Ozempic 1 mg/dose (4 mg/3 mL) pnij; 1 mg by SubCUTAneous route every seven (7) days.  -     metFORMIN ER (GLUCOPHAGE XR) 500 mg tablet; Take 2 Tablets by mouth two (2) times a day.  -     insulin glargine (LANTUS,BASAGLAR) 100 unit/mL (3 mL) inpn; 32 Units by SubCUTAneous route nightly.  -     glucose blood VI test strips (True Metrix Glucose Test Strip) strip; Test BID    2. Essential (primary) hypertension  Comments:  Continue medications.  Orders:  -     CBC WITH AUTOMATED DIFF; Future  -     METABOLIC PANEL, COMPREHENSIVE; Future    3. Mixed hyperlipidemia  Comments:  Due for fasting labs, patient to schedule appt.   Orders:  -     LIPID PANEL; Future  -     METABOLIC PANEL, COMPREHENSIVE; Future    4. Microalbuminuria  Comments:  Checking microalbumin today.    5. Class 3 severe obesity due to excess calories with serious comorbidity and body mass index (BMI) of 40.0 to 44.9 in adult [facility])  Comments:  Discussed increasing activity to 150 minutes moderate intensity exercise a week.    6. Generalized anxiety disorder  Comments:  Stable on celexa.       Follow-up and Dispositions    Return in about 3 months (around 12/01/2021) for F/U: , DM, HTN, HLD, Anxiety.          Sandy King is being evaluated by a Virtual Visit (video visit) encounter to address concerns as mentioned above.  A caregiver was present when appropriate. Due to this being a (During COVID-19 public health  emergency), evaluation of the following organ systems was limited: Vitals/Constitutional/EENT/Resp/CV/GI/GU/MS/Neuro/Skin/Heme-Lymph-Imm.  Pursuant to the emergency declaration under the [facility] Act and the [employer], 1135 waiver authority and the [employer] Act, this Virtual Visit was conducted with patient's (and/or legal  guardian's) consent, to reduce the patient's risk of exposure to COVID-19 and provide necessary medical care.  The patient (and/or legal guardian) has also been advised to contact this office for worsening conditions or problems, and seek emergency medical treatment and/or call 911 if deemed necessary.    Patient identification was verified at the start of the visit: YES    Services were provided through a video synchronous discussion virtually to substitute for in-person clinic visit. Patient was located at home and provider was located in office or at home.       Tren??e Shelva Majestic, MD  [address] [facility]  [id number] [address] [facility]

## 2021-09-01 NOTE — Progress Notes (Signed)
 1. Have you been to the ER, urgent care clinic since your last visit?  Hospitalized since your last visit? No    2. Have you seen or consulted any other health care providers outside of the [facility] since your last visit? No     3. For patients aged 45-75: Has the patient had a colonoscopy / FIT/ Cologuard? No      If the patient is female:    4. For patients aged 74-74: Has the patient had a mammogram within the past 2 years? No      5. For patients aged 21-65: Has the patient had a pap smear? Yes - Care Gap present. Most recent result on file  Chief Complaint   Patient presents with    Medication Question     Question regarding Insulin

## 2021-09-03 ENCOUNTER — Encounter: Primary: Family Medicine

## 2021-09-03 ENCOUNTER — Encounter: Admit: 2021-09-03 | Discharge: 2021-09-03 | Payer: MEDICAID | Primary: Family Medicine

## 2021-09-03 NOTE — Progress Notes (Signed)
Progress  Notes by Dorinda Hill, MD at 09/03/21 2359                Author: Dorinda Hill, MD  Service: --  Author Type: Physician       Filed: 09/10/21 1227  Encounter Date: 09/03/2021  Status: Signed          Editor: Dorinda Hill, MD (Physician)               Called patient.   DM- uncontrolled as expected but worse than anticipated.   Fasting glucose 180.   After dinner >200.   She is currently on 36u of lantus with fasting 140-180.   -She is to go up to 40u of lantus now.      Microalbuminuria- stable. Continue lisinopril.       Elevated LDL- not at goal. Increasing rosuvastatin to 40mg  (patient was supposed to already be taking 40mg  but admits she was only doing 20mg )      Kidney/Liver function normal.       Will have her follow up in Jan 2023.

## 2021-09-04 LAB — CBC WITH AUTO DIFFERENTIAL
Basophils %: 1 %
Basophils Absolute: 0.1 10*3/uL (ref 0.0–0.2)
Eosinophils %: 2 %
Eosinophils Absolute: 0.1 10*3/uL (ref 0.0–0.4)
Granulocyte Absolute Count: 0 10*3/uL (ref 0.0–0.1)
Hematocrit: 40.4 % (ref 34.0–46.6)
Hemoglobin: 13 g/dL (ref 11.1–15.9)
Immature Granulocytes: 0 %
Lymphocytes %: 28 %
Lymphocytes Absolute: 2.2 10*3/uL (ref 0.7–3.1)
MCH: 25.4 pg — ABNORMAL LOW (ref 26.6–33.0)
MCHC: 32.2 g/dL (ref 31.5–35.7)
MCV: 79 fL (ref 79–97)
Monocytes %: 6 %
Monocytes Absolute: 0.4 10*3/uL (ref 0.1–0.9)
Neutrophils %: 63 %
Neutrophils Absolute: 4.8 10*3/uL (ref 1.4–7.0)
Platelets: 255 10*3/uL (ref 150–450)
RBC: 5.11 x10E6/uL (ref 3.77–5.28)
RDW: 14.1 % (ref 11.7–15.4)
WBC: 7.7 10*3/uL (ref 3.4–10.8)

## 2021-09-04 LAB — LIPID PANEL
Cholesterol, Total: 162 mg/dL (ref 100–199)
Cholesterol, total: 162 mg/dL (ref 100–199)
HDL Cholesterol: 41 mg/dL (ref >39)
HDL: 41 mg/dL (ref >39)
LDL Calculated: 101 mg/dL — ABNORMAL HIGH (ref 0–99)
LDL, calculated: 101 mg/dL — ABNORMAL HIGH (ref 0–99)
Triglyceride: 108 mg/dL (ref 0–149)
Triglycerides: 108 mg/dL (ref 0–149)
VLDL, calculated: 20 mg/dL (ref 5–40)
VLDL: 20 mg/dL (ref 5–40)

## 2021-09-04 LAB — COMPREHENSIVE METABOLIC PANEL
ALT: 11 IU/L (ref 0–32)
AST: 11 IU/L (ref 0–40)
Albumin/Globulin Ratio: 1.4 NA (ref 1.2–2.2)
Albumin: 4.2 g/dL (ref 3.8–4.8)
Alkaline Phosphatase: 56 IU/L (ref 44–121)
BUN: 9 mg/dL (ref 6–24)
Bun/Cre Ratio: 13 NA (ref 9–23)
CO2: 26 mmol/L (ref 20–29)
Calcium: 9.7 mg/dL (ref 8.7–10.2)
Chloride: 97 mmol/L (ref 96–106)
Creatinine: 0.71 mg/dL (ref 0.57–1.00)
Est, Glomerular Filtration Rate: 107 mL/min/{1.73_m2} (ref >59)
Globulin, Total: 3.1 g/dL (ref 1.5–4.5)
Glucose: 171 mg/dL — ABNORMAL HIGH (ref 70–99)
Potassium: 4.1 mmol/L (ref 3.5–5.2)
Sodium: 139 mmol/L (ref 134–144)
Total Bilirubin: 0.4 mg/dL (ref 0.0–1.2)
Total Protein: 7.3 g/dL (ref 6.0–8.5)

## 2021-09-04 LAB — MICROALBUMIN / CREATININE URINE RATIO
Creatinine, Ur: 37.6 mg/dL
Microalb, Ur: 36.7 ug/mL
Microalbumin Creatinine Ratio: 98 mg/g creat — ABNORMAL HIGH (ref 0–29)

## 2021-09-04 LAB — HEMOGLOBIN A1C W/EAG
Hemoglobin A1C: 11.3 % — ABNORMAL HIGH (ref 4.8–5.6)
eAG: 278 mg/dL

## 2021-09-04 LAB — MICROALBUMIN, UR, RAND W/ MICROALB/CREAT RATIO
Creatinine, urine random: 37.6 mg/dL
Microalb/Creat ratio (ug/mg creat.): 98 mg/g creat — ABNORMAL HIGH (ref 0–29)
Microalbumin, urine: 36.7 ug/mL

## 2021-09-04 LAB — CBC WITH AUTOMATED DIFF
ABS. BASOPHILS: 0.1 10*3/uL (ref 0.0–0.2)
ABS. EOSINOPHILS: 0.1 10*3/uL (ref 0.0–0.4)
ABS. IMM. GRANS.: 0 10*3/uL (ref 0.0–0.1)
ABS. MONOCYTES: 0.4 10*3/uL (ref 0.1–0.9)
ABS. NEUTROPHILS: 4.8 10*3/uL (ref 1.4–7.0)
Abs Lymphocytes: 2.2 10*3/uL (ref 0.7–3.1)
BASOPHILS: 1 %
EOSINOPHILS: 2 %
HCT: 40.4 % (ref 34.0–46.6)
HGB: 13 g/dL (ref 11.1–15.9)
IMMATURE GRANULOCYTES: 0 %
Lymphocytes: 28 %
MCH: 25.4 pg — ABNORMAL LOW (ref 26.6–33.0)
MCHC: 32.2 g/dL (ref 31.5–35.7)
MCV: 79 fL (ref 79–97)
MONOCYTES: 6 %
NEUTROPHILS: 63 %
PLATELET: 255 10*3/uL (ref 150–450)
RBC: 5.11 x10E6/uL (ref 3.77–5.28)
RDW: 14.1 % (ref 11.7–15.4)
WBC: 7.7 10*3/uL (ref 3.4–10.8)

## 2021-09-04 LAB — METABOLIC PANEL, COMPREHENSIVE
A-G Ratio: 1.4 (ref 1.2–2.2)
ALT (SGPT): 11 IU/L (ref 0–32)
AST (SGOT): 11 IU/L (ref 0–40)
Albumin: 4.2 g/dL (ref 3.8–4.8)
Alk. phosphatase: 56 IU/L (ref 44–121)
BUN/Creatinine ratio: 13 (ref 9–23)
BUN: 9 mg/dL (ref 6–24)
Bilirubin, total: 0.4 mg/dL (ref 0.0–1.2)
CO2: 26 mmol/L (ref 20–29)
Calcium: 9.7 mg/dL (ref 8.7–10.2)
Chloride: 97 mmol/L (ref 96–106)
Creatinine: 0.71 mg/dL (ref 0.57–1.00)
GLOBULIN, TOTAL: 3.1 g/dL (ref 1.5–4.5)
Glucose: 171 mg/dL — ABNORMAL HIGH (ref 70–99)
Potassium: 4.1 mmol/L (ref 3.5–5.2)
Protein, total: 7.3 g/dL (ref 6.0–8.5)
Sodium: 139 mmol/L (ref 134–144)
eGFR: 107 mL/min/{1.73_m2} (ref >59)

## 2021-09-04 LAB — HEMOGLOBIN A1C WITH EAG
Estimated average glucose: 278 mg/dL
Hemoglobin A1c: 11.3 % — ABNORMAL HIGH (ref 4.8–5.6)

## 2021-09-07 NOTE — Telephone Encounter (Signed)
Ozempic   PA Case: [id number], Status: Approved, Coverage Starts on: 09/07/2021 12:00:00 AM, Coverage Ends on: 09/07/2022 12:00:00 AM.

## 2021-09-10 ENCOUNTER — Encounter

## 2021-09-10 MED ORDER — ROSUVASTATIN 40 MG TAB
40 mg | ORAL_TABLET | Freq: Every evening | ORAL | 1 refills | Status: AC
Start: 2021-09-10 — End: ?

## 2021-09-14 NOTE — Telephone Encounter (Signed)
Pt called and stated that test strips that were sent in do not fit in the machine that she currently has.

## 2021-09-15 NOTE — Telephone Encounter (Signed)
Called and spoke with pharmacy.

## 2021-09-15 NOTE — Telephone Encounter (Signed)
Test strip prescription sent in Sept 27 says test strips to match meter: Contour Next EZ.    Please clarify with pharmacy--they likely did not read the notes.

## 2021-09-15 NOTE — Telephone Encounter (Signed)
Patient stated she is using Contour Next EZ meter and needs test strips to match.

## 2021-09-23 ENCOUNTER — Emergency Department (HOSPITAL_BASED_OUTPATIENT_CLINIC_OR_DEPARTMENT_OTHER)
Admission: EM | Admit: 2021-09-23 | Discharge: 2021-09-23 | Disposition: A | Discharge status: Home / Self Care | Payer: Medicaid Other | Attending: Emergency Medicine | Admitting: Emergency Medicine

## 2021-09-23 ENCOUNTER — Encounter (HOSPITAL_BASED_OUTPATIENT_CLINIC_OR_DEPARTMENT_OTHER): Payer: Self-pay

## 2021-09-23 ENCOUNTER — Other Ambulatory Visit: Payer: Self-pay

## 2021-09-23 DIAGNOSIS — E119 Type 2 diabetes mellitus without complications: Secondary | ICD-10-CM | POA: Diagnosis not present

## 2021-09-23 DIAGNOSIS — Z79899 Other long term (current) drug therapy: Secondary | ICD-10-CM | POA: Diagnosis not present

## 2021-09-23 DIAGNOSIS — Z7984 Long term (current) use of oral hypoglycemic drugs: Secondary | ICD-10-CM | POA: Diagnosis not present

## 2021-09-23 DIAGNOSIS — I10 Essential (primary) hypertension: Secondary | ICD-10-CM | POA: Diagnosis not present

## 2021-09-23 DIAGNOSIS — Z7982 Long term (current) use of aspirin: Secondary | ICD-10-CM | POA: Diagnosis not present

## 2021-09-23 DIAGNOSIS — S199XXA Unspecified injury of neck, initial encounter: Secondary | ICD-10-CM | POA: Diagnosis present

## 2021-09-23 DIAGNOSIS — X58XXXA Exposure to other specified factors, initial encounter: Secondary | ICD-10-CM | POA: Insufficient documentation

## 2021-09-23 DIAGNOSIS — S161XXA Strain of muscle, fascia and tendon at neck level, initial encounter: Secondary | ICD-10-CM

## 2021-09-23 MED ORDER — METHOCARBAMOL 500 MG PO TABS: 500.0000 mg | ORAL_TABLET | Freq: Two times a day (BID) | ORAL | 0 refills | Status: AC

## 2021-09-23 NOTE — Discharge Instructions (Signed)
Take 4 over the counter ibuprofen tablets 3 times a day or 2 over-the-counter naproxen tablets twice a day for pain. Also take tylenol 1000mg (2 extra strength) four times a day.   Do not drive a car, operate heavy machinery.

## 2021-09-23 NOTE — ED Triage Notes (Signed)
Pt c/o right lateral neck pain x 1 week. States took sons muscle relaxer with relief.

## 2021-09-23 NOTE — ED Provider Notes (Signed)
[facility] EMERGENCY DEPARTMENT Provider Note   CSN: [id number] Arrival date & time: 09/23/21  1015     History Chief Complaint  Patient presents with   Neck Pain    Cynthia Byrd is a 45 y.o. female.  45 yo F with a chief complaint of neck pain.  Going on for about a week or so.  She has tried a muscle relaxant at home with some improvement.  She denies trauma denies fevers.  Worse with extreme rotation to the right or to the left.  Denies numbness or weakness to the arms.  The history is provided by the patient.  Neck Pain Pain location:  Unable to specify Quality:  Aching Pain radiates to:  Does not radiate Pain severity:  Moderate Onset quality:  Gradual Duration:  2 weeks Timing:  Constant Progression:  Waxing and waning Chronicity:  New Relieved by:  Nothing Worsened by:  Position and twisting Ineffective treatments:  None tried Associated symptoms: no chest pain, no fever and no headaches       Past Medical History:  Diagnosis Date   Diabetes mellitus without complication (HCC)    Headache    Hypertension     There are no problems to display for this patient.   Past Surgical History:  Procedure Laterality Date   DENTAL SURGERY     FOOT SURGERY     TUBAL LIGATION       OB History   No obstetric history on file.     Family History  Problem Relation Age of Onset   Hypertension Mother    Healthy Father     Social History   Tobacco Use   Smoking status: Never   Smokeless tobacco: Never  Vaping Use   Vaping Use: Never used  Substance Use Topics   Alcohol use: Yes    Comment: occ   Drug use: No    Home Medications Prior to Admission medications   Medication Sig Start Date End Date Taking? Authorizing Provider  methocarbamol (ROBAXIN) 500 MG tablet Take 1 tablet (500 mg total) by mouth 2 (two) times daily. 09/23/21  Yes Melene Plan, DO  amLODipine (NORVASC) 5 MG tablet Take 5 mg by mouth daily.    [provider]   amoxicillin (AMOXIL) 500 MG capsule Take 1 capsule (500 mg total) by mouth 3 (three) times daily. 12/01/20   Frederica Kuster, MD  aspirin-acetaminophen-caffeine (EXCEDRIN MIGRAINE) [phone] MG tablet Take by mouth every 6 (six) hours as needed for headache.    [provider]  benzonatate (TESSALON PERLES) 100 MG capsule Take 1 capsule (100 mg total) by mouth 3 (three) times daily as needed for cough. 11/10/18   Aviva Kluver B, PA-C  citalopram (CELEXA) 10 MG tablet Take 10 mg daily by mouth.    [provider]  glipiZIDE (GLUCOTROL) 10 MG tablet Take 10 mg by mouth 2 (two) times daily before a meal.    [provider]  guaiFENesin (MUCINEX) 600 MG 12 hr tablet Take 1 tablet (600 mg total) by mouth 2 (two) times daily. 11/10/18   Aviva Kluver B, PA-C  liraglutide (VICTOZA) 18 MG/3ML SOPN Inject into the skin.    [provider]  metFORMIN (GLUCOPHAGE) 1000 MG tablet Take 1,000 mg by mouth 2 (two) times daily with a meal.    [provider]  oseltamivir (TAMIFLU) 75 MG capsule Take 1 capsule (75 mg total) by mouth every 12 (twelve) hours. 01/15/17   Lurene Shadow,  PA-C  phenazopyridine (PYRIDIUM) 200 MG tablet Take 1 tablet (200 mg total) by mouth 3 (three) times daily. 03/21/20   Pollyann Savoy, MD  Rizatriptan Benzoate (MAXALT PO) Take by mouth.    [provider]  Semaglutide (OZEMPIC, 0.25 OR 0.5 MG/DOSE, [place]) Inject into the skin.    [provider]  Topiramate (TOPAMAX PO) Take by mouth.    [provider]    Allergies    Codeine and Percocet [oxycodone-acetaminophen]  Review of Systems   Review of Systems  Constitutional:  Negative for chills and fever.  HENT:  Negative for congestion and rhinorrhea.   Eyes:  Negative for redness and visual disturbance.  Respiratory:  Negative for shortness of breath and wheezing.   Cardiovascular:  Negative for chest pain and palpitations.  Gastrointestinal:  Negative  for nausea and vomiting.  Genitourinary:  Negative for dysuria and urgency.  Musculoskeletal:  Positive for neck pain. Negative for arthralgias and myalgias.  Skin:  Negative for pallor and wound.  Neurological:  Negative for dizziness and headaches.   Physical Exam Updated Vital Signs BP (!) 148/98 (BP Location: Left Arm)   Pulse 92   Temp 98.3 F (36.8 C) (Oral)   Resp 18   Ht 5\' 8"  (1.727 m)   Wt 120.2 kg   LMP 09/16/2021 (Approximate)   SpO2 100%   BMI 40.29 kg/m   Physical Exam Vitals and nursing note reviewed.  Constitutional:      General: She is not in acute distress.    Appearance: She is well-developed. She is not diaphoretic.  HENT:     Head: Normocephalic and atraumatic.  Eyes:     Pupils: Pupils are equal, round, and reactive to light.  Cardiovascular:     Rate and Rhythm: Normal rate and regular rhythm.     Heart sounds: No murmur heard.   No friction rub. No gallop.  Pulmonary:     Effort: Pulmonary effort is normal.     Breath sounds: No wheezing or rales.  Abdominal:     General: There is no distension.     Palpations: Abdomen is soft.     Tenderness: There is no abdominal tenderness.  Musculoskeletal:        General: Tenderness present.     Cervical back: Normal range of motion and neck supple.     Comments: Mild tenderness at the base of the C-spine to the paraspinal musculature bilaterally.  No midline spinal tenderness.  She is able to rotate her head 45 degrees in either direction without pain.  When she gets to more of the extreme of rotation she has some discomfort usually on the contralateral side of her movement.  Pulse motor and sensation intact in bilateral upper extremities.  No appreciable weakness.  Negative Spurling's bilaterally.  Skin:    General: Skin is warm and dry.  Neurological:     Mental Status: She is alert and oriented to person, place, and time.  Psychiatric:        Behavior: Behavior normal.    ED Results / Procedures /  Treatments   Labs (all labs ordered are listed, but only abnormal results are displayed) Labs Reviewed - No data to display  EKG None  Radiology No results found.  Procedures Procedures   Medications Ordered in ED Medications - No data to display  ED Course  I have reviewed the triage vital signs and the nursing notes.  Pertinent labs & imaging results that were available  during my care of the patient were reviewed by me and considered in my medical decision making (see chart for details).    MDM Rules/Calculators/A&P                           45 yo F with a chief complaint of neck pain.  Going on for about a week or so.  The patient denies any traumatic injury.  Most likely it is a muscular strain based on history and physical.  She has no red flag symptoms.  I discussed with her different modalities of therapy at home.  We will have her follow-up with her family doctor for possible physical therapy if ongoing symptoms.  12:39 PM:  I have discussed the diagnosis/risks/treatment options with the patient and believe the pt to be eligible for discharge home to follow-up with PCP. We also discussed returning to the ED immediately if new or worsening sx occur. We discussed the sx which are most concerning (e.g., sudden worsening pain, fever, inability to tolerate by mouth) that necessitate immediate return. Medications administered to the patient during their visit and any new prescriptions provided to the patient are listed below.  Medications given during this visit Medications - No data to display   The patient appears reasonably screen and/or stabilized for discharge and I doubt any other medical condition or other [facility] requiring further screening, evaluation, or treatment in the ED at this time prior to discharge.   Final Clinical Impression(s) / ED Diagnoses Final diagnoses:  Acute strain of neck muscle, initial encounter    Rx / DC Orders ED Discharge Orders           Ordered    methocarbamol (ROBAXIN) 500 MG tablet  2 times daily        09/23/21 1223             Melene Plan, DO 09/23/21 1239

## 2021-10-03 ENCOUNTER — Other Ambulatory Visit: Payer: Self-pay

## 2021-10-03 ENCOUNTER — Emergency Department (HOSPITAL_BASED_OUTPATIENT_CLINIC_OR_DEPARTMENT_OTHER)
Admission: EM | Admit: 2021-10-03 | Discharge: 2021-10-03 | Disposition: A | Discharge status: Home / Self Care | Payer: Medicaid Other | Attending: Emergency Medicine | Admitting: Emergency Medicine

## 2021-10-03 ENCOUNTER — Encounter (HOSPITAL_BASED_OUTPATIENT_CLINIC_OR_DEPARTMENT_OTHER): Payer: Self-pay | Admitting: *Deleted

## 2021-10-03 DIAGNOSIS — L02213 Cutaneous abscess of chest wall: Secondary | ICD-10-CM | POA: Diagnosis not present

## 2021-10-03 DIAGNOSIS — Z7982 Long term (current) use of aspirin: Secondary | ICD-10-CM | POA: Insufficient documentation

## 2021-10-03 DIAGNOSIS — Z79899 Other long term (current) drug therapy: Secondary | ICD-10-CM | POA: Insufficient documentation

## 2021-10-03 DIAGNOSIS — L0291 Cutaneous abscess, unspecified: Secondary | ICD-10-CM

## 2021-10-03 DIAGNOSIS — Z794 Long term (current) use of insulin: Secondary | ICD-10-CM | POA: Diagnosis not present

## 2021-10-03 DIAGNOSIS — I10 Essential (primary) hypertension: Secondary | ICD-10-CM | POA: Insufficient documentation

## 2021-10-03 DIAGNOSIS — Z7984 Long term (current) use of oral hypoglycemic drugs: Secondary | ICD-10-CM | POA: Diagnosis not present

## 2021-10-03 DIAGNOSIS — E119 Type 2 diabetes mellitus without complications: Secondary | ICD-10-CM | POA: Insufficient documentation

## 2021-10-03 MED ORDER — DOXYCYCLINE HYCLATE 100 MG PO CAPS: 100.0000 mg | ORAL_CAPSULE | Freq: Two times a day (BID) | ORAL | 0 refills | Status: AC

## 2021-10-03 MED ORDER — LIDOCAINE HCL (PF) 1 % IJ SOLN
10.0000 mL | Freq: Once | INTRAMUSCULAR | Status: AC
  Administered 2021-10-03: 5 mL
  Filled 2021-10-03: qty 10

## 2021-10-03 NOTE — ED Provider Notes (Signed)
[facility] EMERGENCY DEPARTMENT Provider Note   CSN: [id number] Arrival date & time: 10/03/21  1628     History Chief Complaint  Patient presents with   Wound Check    Cynthia Byrd is a 45 y.o. female with a history of diabetes mellitus and hypertension.  Presents emergency department with abscess.  Patient reports that she has noticed swelling, tenderness, color change, and warmth to superior edge of keloid scar on her chest.  Symptoms first noticed yesterday evening.  Patient complains of pain to affected area.  Rates pain 5/10 on the pain scale.  Pain is intermittent and present with touch.  Patient reports she has not tried any modalities to alleviate her symptoms.  Patient denies any fever, chills, nausea, vomiting, diarrhea.   Wound Check      Past Medical History:  Diagnosis Date   Diabetes mellitus without complication (HCC)    Headache    Hypertension     There are no problems to display for this patient.   Past Surgical History:  Procedure Laterality Date   DENTAL SURGERY     FOOT SURGERY     TUBAL LIGATION       OB History   No obstetric history on file.     Family History  Problem Relation Age of Onset   Hypertension Mother    Healthy Father     Social History   Tobacco Use   Smoking status: Never   Smokeless tobacco: Never  Vaping Use   Vaping Use: Never used  Substance Use Topics   Alcohol use: Yes    Comment: occ   Drug use: No    Home Medications Prior to Admission medications   Medication Sig Start Date End Date Taking? Authorizing Provider  amLODipine (NORVASC) 5 MG tablet Take 5 mg by mouth daily.    [provider]  amoxicillin (AMOXIL) 500 MG capsule Take 1 capsule (500 mg total) by mouth 3 (three) times daily. 12/01/20   Frederica Kuster, MD  aspirin-acetaminophen-caffeine (EXCEDRIN MIGRAINE) [phone] MG tablet Take by mouth every 6 (six) hours as needed for headache.    [provider]   benzonatate (TESSALON PERLES) 100 MG capsule Take 1 capsule (100 mg total) by mouth 3 (three) times daily as needed for cough. 11/10/18   Aviva Kluver B, PA-C  citalopram (CELEXA) 10 MG tablet Take 10 mg daily by mouth.    [provider]  glipiZIDE (GLUCOTROL) 10 MG tablet Take 10 mg by mouth 2 (two) times daily before a meal.    [provider]  guaiFENesin (MUCINEX) 600 MG 12 hr tablet Take 1 tablet (600 mg total) by mouth 2 (two) times daily. 11/10/18   Aviva Kluver B, PA-C  liraglutide (VICTOZA) 18 MG/3ML SOPN Inject into the skin.    [provider]  metFORMIN (GLUCOPHAGE) 1000 MG tablet Take 1,000 mg by mouth 2 (two) times daily with a meal.    [provider]  methocarbamol (ROBAXIN) 500 MG tablet Take 1 tablet (500 mg total) by mouth 2 (two) times daily. 09/23/21   Melene Plan, DO  oseltamivir (TAMIFLU) 75 MG capsule Take 1 capsule (75 mg total) by mouth every 12 (twelve) hours. 01/15/17   Lurene Shadow, PA-C  phenazopyridine (PYRIDIUM) 200 MG tablet Take 1 tablet (200 mg total) by mouth 3 (three) times daily. 03/21/20   Pollyann Savoy, MD  Rizatriptan Benzoate (MAXALT PO) Take by mouth.    [provider]  Semaglutide (  OZEMPIC, 0.25 OR 0.5 MG/DOSE, [place]) Inject into the skin.    [provider]  Topiramate (TOPAMAX PO) Take by mouth.    [provider]    Allergies    Codeine and Percocet [oxycodone-acetaminophen]  Review of Systems   Review of Systems  Constitutional:  Negative for chills and fever.  Gastrointestinal:  Negative for diarrhea, nausea and vomiting.  Musculoskeletal:  Negative for back pain and neck pain.  Skin:  Negative for color change, pallor, rash and wound.   Physical Exam Updated Vital Signs BP (!) 160/96 (BP Location: Right Arm)   Pulse (!) 105   Temp 98.8 F (37.1 C) (Oral)   Resp 18   Ht 5\' 8"  (1.727 m)   Wt 122.5 kg   LMP 09/16/2021 (Approximate)   SpO2 100%   BMI 41.05 kg/m    Physical Exam Vitals and nursing note reviewed.  Constitutional:      General: She is not in acute distress.    Appearance: She is not ill-appearing, toxic-appearing or diaphoretic.  HENT:     Head: Normocephalic.  Eyes:     General: No scleral icterus.       Right eye: No discharge.        Left eye: No discharge.  Cardiovascular:     Rate and Rhythm: Normal rate.  Pulmonary:     Effort: Pulmonary effort is normal.  Chest:     Chest wall: Tenderness present. No mass, lacerations, deformity, swelling or crepitus.     Comments: Patient has keloid scar to right upper chest wall.  On superior edge of scar patient noted to have area of fluctuance and tenderness.  Image as noted below. Skin:    General: Skin is warm and dry.  Neurological:     General: No focal deficit present.     Mental Status: She is alert.     GCS: GCS eye subscore is 4. GCS verbal subscore is 5. GCS motor subscore is 6.  Psychiatric:        Behavior: Behavior is cooperative.      ED Results / Procedures / Treatments   Labs (all labs ordered are listed, but only abnormal results are displayed) Labs Reviewed - No data to display  EKG None  Radiology No results found.  Procedures .12/12/2022Incision and Drainage  Date/Time: 10/03/2021 6:54 PM Performed by: 10/05/2021, PA-C Authorized by: Haskel Schroeder, PA-C   Consent:    Consent obtained:  Verbal   Consent given by:  Patient   Risks discussed:  Bleeding, incomplete drainage, pain, damage to other organs and infection   Alternatives discussed:  No treatment and delayed treatment Universal protocol:    Procedure explained and questions answered to patient or proxy's satisfaction: yes     Immediately prior to procedure, a time out was called: yes     Patient identity confirmed:  Verbally with patient Location:    Type:  Abscess   Size:  1cm x 1cm   Location:  Trunk   Trunk location:  Chest Pre-procedure details:    Skin preparation:   Betadine Anesthesia:    Anesthesia method:  Local infiltration   Local anesthetic:  Lidocaine 1% w/o epi Procedure type:    Complexity:  Complex Procedure details:    Incision types:  Cruciate   Incision depth:  Subcutaneous   Wound management:  Irrigated with saline   Drainage:  Purulent   Drainage amount:  Moderate Post-procedure details:    Procedure  completion:  Tolerated well, no immediate complications Comments:     Thick purulent material was expressed from abscess.  Minimal purulent fluid was expressed.   Medications Ordered in ED Medications - No data to display  ED Course  I have reviewed the triage vital signs and the nursing notes.  Pertinent labs & imaging results that were available during my care of the patient were reviewed by me and considered in my medical decision making (see chart for details).    MDM Rules/Calculators/A&P                           Alert 45 year old female no acute distress, nontoxic-appearing.  Presents to ED with chief complaint of abscess.  Patient reports tenderness, swelling, and color change to her keloid scar.  On exam patient noted to have fluctuance over affected area.  Suspect abscess.  Discussed incision and drainage procedure with patient.  Patient elects for incision and drainage at this time.  See procedure as noted above.  Due to concern for possible cellulitis patient was started on 7-day course of doxycycline.  Patient to follow-up with primary care provider for wound recheck in 3 days.  Discussed results, findings, treatment and follow up. Patient advised of return precautions. Patient verbalized understanding and agreed with plan.   Final Clinical Impression(s) / ED Diagnoses Final diagnoses:  Abscess    Rx / DC Orders ED Discharge Orders          Ordered    doxycycline (VIBRAMYCIN) 100 MG capsule  2 times daily        10/03/21 1828             Haskel Schroeder, PA-C 10/03/21 1856    Benjiman Core, MD 10/04/21 [phone]

## 2021-10-03 NOTE — ED Triage Notes (Signed)
Pt has raised keloid scar to anterior right chest. States last night it was more painful and feels warm to touch

## 2021-10-03 NOTE — Discharge Instructions (Addendum)
[facility] today to be evaluated for the swelling in tenderness around your keloid scar.  Your exam was consistent with an abscess.  Due to this you had an incision and drainage performed.  Please apply warm compresses 3-4 times daily over the next 5 days.  Please follow-up with your primary care provider for wound reassessment.  I have given you prescription for antibiotics.  Please take the antibiotic, Doxycycline, every 12 hours until gone. A side effect of this medication includes hypersensitivity to the suns rays - please take measures to protect your skin from the sun while taking this medication.   Please take Ibuprofen (Advil, motrin) and Tylenol (acetaminophen) to relieve your pain.    You may take up to 600 MG (3 pills) of normal strength ibuprofen every 8 hours as needed.   You make take tylenol, up to 1,000 mg (two extra strength pills) every 8 hours as needed.   It is safe to take ibuprofen and tylenol at the same time as they work differently.   Do not take more than 3,000 mg tylenol in a 24 hour period (not more than one dose every 8 hours.  Please check all medication labels as many medications such as pain and cold medications may contain tylenol.  Do not drink alcohol while taking these medications.  Do not take other NSAID'S while taking ibuprofen (such as aleve or naproxen).  Please take ibuprofen with food to decrease stomach upset.  You may have diarrhea from the antibiotics.  It is very important that you continue to take the antibiotics even if you get diarrhea unless a medical professional tells you that you may stop taking them.  If you stop too early the bacteria you are being treated for will become stronger and you may need different, more powerful antibiotics that have more side effects and worsening diarrhea.  Please stay well hydrated and consider probiotics as they may decrease the severity of your diarrhea.  Please be aware that if you take any  hormonal contraception (birth control pills, nexplanon, the ring, etc) that your birth control will not work while you are taking antibiotics and you need to use back up protection as directed on the birth control medication information insert.   Get help right away if: You have severe pain or bleeding. You cannot eat or drink without vomiting. You have decreased urine output. You become short of breath. You have chest pain. You cough up blood. The affected area becomes numb or starts to tingle.

## 2021-10-22 ENCOUNTER — Encounter: Payer: MEDICAID | Attending: Family | Primary: Family Medicine

## 2021-10-23 ENCOUNTER — Encounter

## 2021-10-23 MED ORDER — LANTUS SOLOSTAR U-100 INSULIN 100 UNIT/ML (3 ML) SUBCUTANEOUS PEN
1003 unit/mL (3 mL) | SUBCUTANEOUS | 0 refills | Status: AC
Start: 2021-10-23 — End: 2021-12-18

## 2021-10-27 ENCOUNTER — Telehealth: Admit: 2021-10-27 | Discharge: 2021-10-27 | Payer: MEDICAID | Attending: Family Medicine | Primary: Family Medicine

## 2021-10-27 DIAGNOSIS — Z0289 Encounter for other administrative examinations: Secondary | ICD-10-CM

## 2021-10-27 NOTE — Progress Notes (Signed)
HPI     Chief Complaint   Patient presents with    Form Completion        HPI:  Sandy King is a 45 y.o. female who is requesting a medical exemption request for COVID 19 vaccination.     Patient has never received COVID 19 vaccine. No prior hx of reaction to vaccination. She personally does not want to get it.    She has a job working at a hospital and if she does not get the vaccine she will have to forego the job so she is requesting I provide her a medical exemption.     Review of Systems   Constitutional:  Negative for chills and fever.     Reviewed PmHx, FmHx, SocHx as well as meds and allergies, updated and dated in the chart.       Objective     Vital Signs: (As obtained by patient/caregiver at home)  There were no vitals taken for this visit.   Patient-Reported Vitals 07/24/2020   Patient-Reported Weight 262 lb       [INSTRUCTIONS:  "[x] " Indicates a positive item  "[] " Indicates a negative item  -- DELETE ALL ITEMS NOT EXAMINED]    Constitutional: [x]  Appears well-developed and well-nourished [x]  No apparent distress      []  Abnormal -     Mental status: [x]  Alert and awake  [x]  Oriented to person/place/time [x]  Able to follow commands    []  Abnormal -     Eyes:   EOM    [x]   Normal    []  Abnormal -   Sclera  [x]   Normal    []  Abnormal -          Discharge [x]   None visible   []  Abnormal -     HENT: [x]  Normocephalic, atraumatic  []  Abnormal -   [x]  Mouth/Throat: Mucous membranes are moist    External Ears [x]  Normal  []  Abnormal -    Neck: [x]  No visualized mass []  Abnormal -     Pulmonary/Chest: [x]  Respiratory effort normal   [x]  No visualized signs of difficulty breathing or respiratory distress        []  Abnormal -      Musculoskeletal:   []  Normal gait with no signs of ataxia         [x]  Normal range of motion of neck        []  Abnormal -     Neurological:        [x]  No Facial Asymmetry (Cranial nerve 7 motor function) (limited exam due to video visit)          [x]  No gaze palsy        []   Abnormal -          Skin:        [x]  No significant exanthematous lesions or discoloration noted on facial skin         []  Abnormal -            Psychiatric:       [x]  Normal Affect []  Abnormal -        [x]  No Hallucinations    Other pertinent observable physical exam findings:-          Assessment and Plan     Patient does not have a medical reason to exempt her from vaccination. If she personally chooses not to do so,  that is up to her.  Medical exemption form cannot be provided.     Diagnoses and all orders for this visit:    1. Encounter for other administrative examinations            Rotha Naraya Stoneberg is being evaluated by a Virtual Visit (video visit) encounter to address concerns as mentioned above.  A caregiver was present when appropriate. Due to this being a Scientist, research (medical) (During COVID-19 public health emergency), evaluation of the following organ systems was limited: Vitals/Constitutional/EENT/Resp/CV/GI/GU/MS/Neuro/Skin/Heme-Lymph-Imm.  Pursuant to the emergency declaration under the [facility] Act and the [employer], 1135 waiver authority and the [employer] Act, this Virtual Visit was conducted with patient's (and/or legal guardian's) consent, to reduce the patient's risk of exposure to COVID-19 and provide necessary medical care.  The patient (and/or legal guardian) has also been advised to contact this office for worsening conditions or problems, and seek emergency medical treatment and/or call 911 if deemed necessary.    Patient identification was verified at the start of the visit: YES    Services were provided through a video synchronous discussion virtually to substitute for in-person clinic visit. Patient was located at home and provider was located in office or at home.       Tren??e Shelva Majestic, MD  [address] [facility]  [phone] [facility]

## 2021-10-27 NOTE — Progress Notes (Signed)
 1. Have you been to the ER, urgent care clinic since your last visit?  Hospitalized since your last visit? No    2. Have you seen or consulted any other health care providers outside of the [facility] since your last visit? No     3. For patients aged 45-75: Has the patient had a colonoscopy / FIT/ Cologuard? No      If the patient is female:    4. For patients aged 38-74: Has the patient had a mammogram within the past 2 years? No      5. For patients aged 21-65: Has the patient had a pap smear? Yes - Care Gap present. Most recent result on file  Chief Complaint   Patient presents with    Form Completion

## 2021-11-13 ENCOUNTER — Encounter: Attending: Family Medicine | Primary: Family Medicine

## 2021-12-11 ENCOUNTER — Encounter: Payer: MEDICAID | Attending: Family Medicine | Primary: Family Medicine

## 2021-12-15 ENCOUNTER — Encounter

## 2021-12-18 MED ORDER — JANUVIA 100 MG TABLET
100 mg | ORAL_TABLET | ORAL | 1 refills | Status: AC
Start: 2021-12-18 — End: ?

## 2021-12-18 MED ORDER — LANTUS SOLOSTAR U-100 INSULIN 100 UNIT/ML (3 ML) SUBCUTANEOUS PEN
100 unit/mL (3 mL) | SUBCUTANEOUS | 1 refills | Status: AC
Start: 2021-12-18 — End: ?

## 2021-12-18 MED ORDER — AMLODIPINE 10 MG TAB
10 mg | ORAL_TABLET | ORAL | 1 refills | Status: AC
Start: 2021-12-18 — End: ?

## 2021-12-18 MED ORDER — METFORMIN SR 500 MG 24 HR TABLET
500 mg | ORAL_TABLET | ORAL | 1 refills | Status: AC
Start: 2021-12-18 — End: ?

## 2021-12-18 MED ORDER — CITALOPRAM 40 MG TAB
40 mg | ORAL_TABLET | ORAL | 1 refills | Status: AC
Start: 2021-12-18 — End: ?

## 2021-12-18 MED ORDER — LISINOPRIL 10 MG TAB
10 mg | ORAL_TABLET | ORAL | 1 refills | Status: AC
Start: 2021-12-18 — End: ?

## 2022-01-10 ENCOUNTER — Encounter (HOSPITAL_BASED_OUTPATIENT_CLINIC_OR_DEPARTMENT_OTHER): Payer: Self-pay | Admitting: *Deleted

## 2022-01-10 ENCOUNTER — Emergency Department (HOSPITAL_BASED_OUTPATIENT_CLINIC_OR_DEPARTMENT_OTHER)
Admission: EM | Admit: 2022-01-10 | Discharge: 2022-01-10 | Disposition: A | Discharge status: Home / Self Care | Payer: Medicaid Other | Attending: Emergency Medicine | Admitting: Emergency Medicine

## 2022-01-10 ENCOUNTER — Other Ambulatory Visit: Payer: Self-pay

## 2022-01-10 DIAGNOSIS — K5900 Constipation, unspecified: Secondary | ICD-10-CM | POA: Diagnosis not present

## 2022-01-10 DIAGNOSIS — Z79899 Other long term (current) drug therapy: Secondary | ICD-10-CM | POA: Diagnosis not present

## 2022-01-10 DIAGNOSIS — Z7982 Long term (current) use of aspirin: Secondary | ICD-10-CM | POA: Insufficient documentation

## 2022-01-10 DIAGNOSIS — Z7984 Long term (current) use of oral hypoglycemic drugs: Secondary | ICD-10-CM | POA: Insufficient documentation

## 2022-01-10 MED ORDER — LIDOCAINE 5 % EX OINT: 1.0000 "application " | TOPICAL_OINTMENT | CUTANEOUS | 0 refills | Status: AC | PRN

## 2022-01-10 MED ORDER — KETOROLAC TROMETHAMINE 60 MG/2ML IM SOLN
30.0000 mg | Freq: Once | INTRAMUSCULAR | Status: AC
  Administered 2022-01-10: 30 mg via INTRAMUSCULAR
  Filled 2022-01-10: qty 2

## 2022-01-10 NOTE — Discharge Instructions (Addendum)
You are seen here today for evaluation of your.  I am glad we were able to help you with this and you were able to produce a bowel movement.  I would continue to stay well-hydrated and eating foods with plenty of fiber to help avoid this situation in the future.  You can use MiraLAX daily to keep your stool the consistency of peanut butter.  Additionally, I have prescribed you topical lidocaine to apply to your anus as needed for pain due to your anal fissures there.  If for the reason your insurance does not cover it, you can pick up a topical lidocaine spray from over-the-counter.  I would discuss this incident with your primary care provider at your appointment on Friday but you may possibly need a colonoscopy earlier.  If you have any concern, new or worsening symptoms, please return the nearest emergency room for reevaluation.

## 2022-01-10 NOTE — ED Provider Notes (Signed)
[facility] EMERGENCY DEPARTMENT Provider Note   CSN: [id number] Arrival date & time: 01/10/22  1406     History Chief Complaint  Patient presents with   Constipation    Cynthia Byrd is a 46 y.o. female presents to the ED for evaluation of rectal pain and pressure for the past day. The patient reports her last bowel movement was on Friday and was hard. Denies any dark stools. Denies any BRBPR or hematochezia. Today, she experienced rectal pain and pressure. She denies any urinary symptoms. Denies any abdominal pain. She reports she attempt to remove the stool with her finger, but was unable to. She reports it felt like "a hard rock". Additionally, she tried a fleet enema without success. Medical history of DM and HTN. Denies any new medications or new foods. Denies any narcotic pain medication use.    Constipation Associated symptoms: no abdominal pain, no diarrhea, no dysuria, no fever, no nausea and no vomiting       Home Medications Prior to Admission medications   Medication Sig Start Date End Date Taking? Authorizing Provider  amLODipine (NORVASC) 5 MG tablet Take 5 mg by mouth daily.    [provider]  aspirin-acetaminophen-caffeine (EXCEDRIN MIGRAINE) [phone] MG tablet Take by mouth every 6 (six) hours as needed for headache.    [provider]  benzonatate (TESSALON PERLES) 100 MG capsule Take 1 capsule (100 mg total) by mouth 3 (three) times daily as needed for cough. 11/10/18   Langston Masker B, PA-C  citalopram (CELEXA) 10 MG tablet Take 10 mg daily by mouth.    [provider]  glipiZIDE (GLUCOTROL) 10 MG tablet Take 10 mg by mouth 2 (two) times daily before a meal.    [provider]  guaiFENesin (MUCINEX) 600 MG 12 hr tablet Take 1 tablet (600 mg total) by mouth 2 (two) times daily. 11/10/18   Langston Masker B, PA-C  liraglutide (VICTOZA) 18 MG/3ML SOPN Inject into the skin.    [provider]  metFORMIN (GLUCOPHAGE)  1000 MG tablet Take 1,000 mg by mouth 2 (two) times daily with a meal.    [provider]  methocarbamol (ROBAXIN) 500 MG tablet Take 1 tablet (500 mg total) by mouth 2 (two) times daily. 09/23/21   Deno Etienne, DO  oseltamivir (TAMIFLU) 75 MG capsule Take 1 capsule (75 mg total) by mouth every 12 (twelve) hours. 01/15/17   Noe Gens, PA-C  phenazopyridine (PYRIDIUM) 200 MG tablet Take 1 tablet (200 mg total) by mouth 3 (three) times daily. 03/21/20   Truddie Hidden, MD  Rizatriptan Benzoate (MAXALT PO) Take by mouth.    [provider]  Semaglutide (OZEMPIC, 0.25 OR 0.5 MG/DOSE, [place]) Inject into the skin.    [provider]  Topiramate (TOPAMAX PO) Take by mouth.    [provider]      Allergies    Codeine and Percocet [oxycodone-acetaminophen]    Review of Systems   Review of Systems  Constitutional:  Negative for chills and fever.  Respiratory:  Negative for shortness of breath.   Cardiovascular:  Negative for chest pain.  Gastrointestinal:  Positive for constipation and rectal pain. Negative for abdominal pain, anal bleeding, blood in stool, diarrhea, nausea and vomiting.  Genitourinary:  Negative for dysuria and hematuria.   Physical Exam Updated Vital Signs BP (!) 141/79    Pulse 96    Temp 98.2 F (36.8 C) (Oral)    Resp 20    Ht  5\' 8"  (1.727 m)    Wt 120.2 kg    LMP 01/04/2022 (Approximate)    SpO2 98%    BMI 40.29 kg/m  Physical Exam Vitals and nursing note reviewed. Exam conducted with a chaperone present Janett Billow, RN).  Constitutional:      General: She is not in acute distress.    Appearance: She is not toxic-appearing.  HENT:     Head: Normocephalic and atraumatic.  Eyes:     General: No scleral icterus. Cardiovascular:     Rate and Rhythm: Normal rate and regular rhythm.  Pulmonary:     Effort: Pulmonary effort is normal. No respiratory distress.     Breath sounds: Normal breath sounds.  Abdominal:     General: Bowel  sounds are normal.     Palpations: Abdomen is soft.     Tenderness: There is no abdominal tenderness. There is no guarding or rebound.  Genitourinary:    Rectum: Tenderness present.     Comments: Anal fissures visualized from the 3 o'clock to 8 o'clock position. No rectal bleeding. No hemorrhoid visible. Hard block of stool noted in the vault. Clay colored stool.  Musculoskeletal:        General: No deformity.  Skin:    General: Skin is warm and dry.  Neurological:     General: No focal deficit present.     Mental Status: She is alert. Mental status is at baseline.    ED Results / Procedures / Treatments   Labs (all labs ordered are listed, but only abnormal results are displayed) Labs Reviewed - No data to display  EKG None  Radiology No results found.  Procedures Procedures    Medications Ordered in ED Medications  ketorolac (TORADOL) injection 30 mg (30 mg Intramuscular Given 01/10/22 1553)    ED Course/ Medical Decision Making/ A&P                           Medical Decision Making Risk Prescription drug management.   46 year old female presents to the emergency department for evaluation of constipation and rectal pain/pressure.  Vital signs show mild hypertension although patient has known history and is in pain.  Afebrile.  Normal heart rate.  Satting well on room air.  Physical exam is pertinent for a few anal fissures seen.  No rectal bleeding.  Clay colored stool.  There was a hard solid block of stool in the rectal vault.  Abdomen soft and nontender.  Normal active bowel sounds.  I was able to remove some of the stool with digital disimpaction and break up some of the hard block.  The patient was able to have a small bowel movement after this, but still felt like there was more stool.  Ordered soap suds enema. The patient reports she had a very large BM and was feeling complete relief and was able to sit on her bottom again.   The patient was given Toradol in the  emergency department for pain.  I discussed with the patient that she will need to take Miralax to soften her stool so that it is the consistency of peanut butter. I will prescribe her topical lidocaine for her anal fissures. Recommended follow up with PCP for evaluation of her constipation as she may need a colonoscopy. The patient reports she has a PCP appointment on Friday and will discuss it then. Return precautions discussed. Patient agrees to plan. The patient is stable and being discharged home  in good condition.   Final Clinical Impression(s) / ED Diagnoses Final diagnoses:  Constipation, unspecified constipation type    Rx / DC Orders ED Discharge Orders     None         Sherrell Puller, PA-C 01/10/22 1833    Truddie Hidden, MD 01/10/22 [phone]

## 2022-01-10 NOTE — ED Notes (Addendum)
Pt took ~500 cc of soap suds enema rectally without successful BM. Pt requesting manual disimpaction. PA made aware.

## 2022-01-10 NOTE — ED Notes (Signed)
Pt had small BM but states she still feels a hard lump of stool near her rectum. PA made aware.

## 2022-01-10 NOTE — ED Notes (Signed)
Pt states she had successful large BM post enema administration. PA aware.

## 2022-01-10 NOTE — ED Triage Notes (Signed)
Pt reports she did an enema today to try to relieve constipation. States she did not have any relief and is having rectal pain and pressure. Reports last BM was on Friday

## 2022-01-15 ENCOUNTER — Telehealth: Admit: 2022-01-15 | Discharge: 2022-01-15 | Payer: MEDICAID | Attending: Family Medicine | Primary: Family Medicine

## 2022-01-15 ENCOUNTER — Telehealth: Attending: Family Medicine | Primary: Family Medicine

## 2022-01-15 ENCOUNTER — Encounter

## 2022-01-15 DIAGNOSIS — E1165 Type 2 diabetes mellitus with hyperglycemia: Secondary | ICD-10-CM

## 2022-01-15 MED ORDER — NURTEC ODT 75 MG DISINTEGRATING TABLET
75 mg | ORAL_TABLET | ORAL | 2 refills | Status: AC
Start: 2022-01-15 — End: ?

## 2022-01-15 NOTE — Telephone Encounter (Signed)
Telephone  Encounter by Veverly Fells at 01/15/22 1414                Author: Veverly Fells  Service: --  Author Type: Technologist       Filed: 01/15/22 1415  Encounter Date: 01/15/2022  Status: Signed          Editor: Veverly Fells (Technologist)               PA started in [facility]

## 2022-01-15 NOTE — Progress Notes (Addendum)
Progress  Notes by Dorinda Hill, MD at 01/15/22 1300                Author: Dorinda Hill, MD  Service: --  Author Type: Physician       Filed: 01/15/22 1345  Encounter Date: 01/15/2022  Status: Addendum          Editor: Dorinda Hill, MD (Physician)          Related Notes: Original Note by Dorinda Hill, MD (Physician) filed at 01/15/22 1318               Sandy King (DOB: 1976-11-30) is a 46 y.o. female, established patient, here for evaluation of the following chief complaint(s):       Cheif Compliant: Chronic follow up          ASSESSMENT/PLAN:   Below is the assessment and plan developed based on review of pertinent history, labs, studies, and medications.      1. Type 2 diabetes mellitus with hyperglycemia, with long-term current use of insulin (HCC)   Comments:   I am hopeful A1c at goal based on reported home #s. Checking labs today. Continue current regimen and augment if necessary. Pt to schedule diabetic eye exam.   Orders:   -     CBC W/O DIFF   -     METABOLIC PANEL, COMPREHENSIVE   -     LIPID PANEL   -     HEMOGLOBIN A1C WITH EAG   -     MICROALBUMIN, UR, RAND W/ MICROALB/CREAT RATIO   2. Migraine without aura and without status migrainosus, not intractable   Comments:   Uncontrolled. Will see if nurtec every other day as a preventative improves migraine pattern. Can use excedrin migraine prn relief. Follow up INI. SE discussed.   Orders:   -     rimegepant (Nurtec ODT) 75 mg disintegrating tablet; Take 1 Tablet by mouth every other day., Normal, Disp-15 Tablet, R-2   3. Essential (primary) hypertension   Comments:   Stable. Continue current regimen. DASH diet encouraged.    4. Mixed hyperlipidemia   Comments:   LDL goal is <70. Checking fasting lipid panel. Continue current regimen.   5. Microalbuminuria   Comments:   Checking labs. Continue lisinopril.    Orders:   -     MICROALBUMIN, UR, RAND W/ MICROALB/CREAT RATIO   6. Screening for malignant neoplasm of colon   Comments:   Referred.    Orders:   -     REFERRAL TO GASTROENTEROLOGY   7. Encounter for screening mammogram for malignant neoplasm of breast   Comments:   Referred.   Orders:   -     MAM MAMMO BI SCREENING INCL CAD; Future      Return in about 3 months (around 04/14/2022) for Chronic:, DM, HTN, HLD.      SUBJECTIVE/OBJECTIVE:   Sandy King is a 46 y.o. female with a history of Uncontrolled T2DM, HTN, Dyslipidemia, GAD, Obesity.       Migraines: patient reports migraines occurring about once a month that are severe enough to cause her to call out of work. Excedrin does not help much.    Imitrex- "didn't do anything"   Topamax- "didn't do anything"   T2DM: Fasting <135. She's noticed great improvement since starting ozempic.    Patient takes metformin 1000mg  XR once a day, januvia 100mg  daily, and lantus 32u nightly (she titrated up from 10u nightly).  Lab Results        Component                Value               Date/Time                   Hemoglobin A1c           11.3 (H)            09/03/2021 09:56 AM         Hemoglobin A1c, Ext*     12.3                12/08/2018 12:00 AM       ACE inhibitor: taking   Last Lipid panel: UTD   Statin: taking   Last Microalbumin: present   Pneumonia vaccine 19-64?: reviewed   Last seen by opthalmology: reviewed       HTN: compliant with medications. No dizziness.       HLD: compliant with statin.   Lab Results   Component Value Date/Time     Cholesterol, total 154 03/20/2021 11:42 AM     HDL Cholesterol 39 (L) 03/20/2021 11:42 AM     LDL, calculated 97 03/20/2021 11:42 AM     VLDL, calculated 18 03/20/2021 11:42 AM     Triglyceride 94 03/20/2021 11:42 AM                    Review of Systems     Respiratory:  Negative for cough and shortness of breath.     Cardiovascular:  Negative for chest pain and palpitations.    Endocrine: Negative for polydipsia and polyphagia.    Neurological:  Positive for headaches. Negative for dizziness and syncope.    Psychiatric/Behavioral:  Negative for hallucinations  and self-injury.         No data recorded       Physical Exam   Constitutional :        General: She is not in acute distress.      Appearance: She is not ill-appearing.     Eyes:       General: No scleral icterus.      Extraocular Movements: Extraocular movements intact.       Conjunctiva/sclera: Conjunctivae normal.    Pulmonary:       Effort: Pulmonary effort is normal. No respiratory distress.     Musculoskeletal:       Cervical back: Normal range of motion. No rigidity.    Skin:      Coloration: Skin is not jaundiced.       Findings: No rash.    Neurological:       General: No focal deficit present.       Mental Status: She is alert and oriented to person, place, and time.    Psychiatric:          Mood and Affect: Mood normal.          Behavior: Behavior normal.             Sandy King, was evaluated through a synchronous (real-time) audio-video encounter. The patient (or guardian if applicable) is aware that this is a billable service, which includes applicable  co-pays. This Virtual Visit was conducted with patient's (and/or legal guardian's) consent. The visit was conducted pursuant to the emergency declaration under the [employer] and the [employer], 113[employer] waiver authority and the Coronavirus  Preparedness and Response Supplemental Appropriations Act.  Patient identification was verified, and a caregiver was present when appropriate.   The patient was located at: Other: car   The provider was located at: [place]: [facility]          An electronic signature was used to authenticate this note.   -- Dorinda Hill, MD

## 2022-01-19 NOTE — Telephone Encounter (Signed)
 Approvedon February 10  PA Case: [id number], Status: Approved, Coverage Starts on: 01/15/2022 12:00:00 AM, Coverage Ends on: 01/15/2023 12:00:00 AM.          Nurtec 75 mg

## 2022-02-26 ENCOUNTER — Ambulatory Visit: Payer: MEDICAID | Primary: Family Medicine

## 2022-04-02 ENCOUNTER — Ambulatory Visit: Payer: MEDICAID | Primary: Family Medicine

## 2022-04-20 ENCOUNTER — Encounter: Payer: MEDICAID | Attending: Family Medicine | Primary: Family Medicine

## 2022-04-20 ENCOUNTER — Encounter: Payer: BLUE CROSS/BLUE SHIELD | Attending: Family Medicine | Primary: Family Medicine

## 2022-04-21 NOTE — Telephone Encounter (Signed)
Patient was called twice for virtual and I left patient left voicemail to return call to medical office.

## 2022-04-22 ENCOUNTER — Encounter: Admit: 2022-04-22 | Discharge: 2022-04-22 | Payer: BLUE CROSS/BLUE SHIELD | Primary: Family Medicine

## 2022-04-23 ENCOUNTER — Encounter

## 2022-04-23 LAB — MICROALBUMIN / CREATININE URINE RATIO
Albumin, U: 65.7 ug/mL
Creatinine, Ur: 204 mg/dL
Microalbumin Creatinine Ratio: 32 mg/g creat — ABNORMAL HIGH (ref 0–29)

## 2022-04-23 LAB — LIPID PANEL
Cholesterol: 157 mg/dL (ref 100–199)
HDL: 35 mg/dL — ABNORMAL LOW (ref >39)
LDL Calculated: 93 mg/dL (ref 0–99)
Triglycerides: 167 mg/dL — ABNORMAL HIGH (ref 0–149)
VLDL Cholesterol Calculated: 29 mg/dL (ref 5–40)

## 2022-04-23 LAB — CBC
Hematocrit: 39 % (ref 34.0–46.6)
Hemoglobin: 12.6 g/dL (ref 11.1–15.9)
MCH: 25.5 pg — ABNORMAL LOW (ref 26.6–33.0)
MCHC: 32.3 g/dL (ref 31.5–35.7)
MCV: 79 fL (ref 79–97)
Platelets: 272 10*3/uL (ref 150–450)
RBC: 4.95 x10E6/uL (ref 3.77–5.28)
RDW: 14.7 % (ref 11.7–15.4)
WBC: 10.2 10*3/uL (ref 3.4–10.8)

## 2022-04-23 LAB — COMPREHENSIVE METABOLIC PANEL
ALT: 12 IU/L (ref 0–32)
AST: 11 IU/L (ref 0–40)
Albumin/Globulin Ratio: 1.6 (ref 1.2–2.2)
Albumin: 4.4 g/dL (ref 3.8–4.8)
Alkaline Phosphatase: 55 IU/L (ref 44–121)
BUN/Creatinine Ratio: 11 (ref 9–23)
BUN: 10 mg/dL (ref 6–24)
CO2: 23 mmol/L (ref 20–29)
Calcium: 9.4 mg/dL (ref 8.7–10.2)
Chloride: 97 mmol/L (ref 96–106)
Creatinine: 0.87 mg/dL (ref 0.57–1.00)
Est, Glomerular Filtration Rate: 84 mL/min/{1.73_m2} (ref >59)
Globulin, Total: 2.8 g/dL (ref 1.5–4.5)
Glucose: 209 mg/dL — ABNORMAL HIGH (ref 70–99)
Potassium: 4.1 mmol/L (ref 3.5–5.2)
Sodium: 137 mmol/L (ref 134–144)
Total Bilirubin: 0.4 mg/dL (ref 0.0–1.2)
Total Protein: 7.2 g/dL (ref 6.0–8.5)

## 2022-04-23 LAB — HEMOGLOBIN A1C: Hemoglobin A1C: 8.1 % — ABNORMAL HIGH (ref 4.8–5.6)

## 2022-04-23 MED ORDER — FREESTYLE LIBRE 3 SENSOR MISC
2 refills | Status: AC
Start: 2022-04-23 — End: 2023-01-10

## 2022-04-23 MED ORDER — METFORMIN HCL ER 500 MG PO TB24
500 MG | ORAL_TABLET | Freq: Two times a day (BID) | ORAL | 0 refills | Status: DC
Start: 2022-04-23 — End: 2022-04-27

## 2022-04-23 MED ORDER — FREESTYLE LIBRE 14 DAY READER DEVI
0 refills | Status: DC
Start: 2022-04-23 — End: 2023-01-10

## 2022-04-27 ENCOUNTER — Telehealth
Admit: 2022-04-27 | Discharge: 2022-04-27 | Payer: BLUE CROSS/BLUE SHIELD | Attending: Family Medicine | Primary: Family Medicine

## 2022-04-27 DIAGNOSIS — Z794 Long term (current) use of insulin: Secondary | ICD-10-CM

## 2022-04-27 MED ORDER — OZEMPIC (2 MG/DOSE) 8 MG/3ML SC SOPN
8 MG/3ML | SUBCUTANEOUS | 1 refills | Status: DC
Start: 2022-04-27 — End: 2022-10-13

## 2022-04-27 MED ORDER — AMLODIPINE BESYLATE 10 MG PO TABS
10 MG | ORAL_TABLET | Freq: Every day | ORAL | 1 refills | Status: DC
Start: 2022-04-27 — End: 2022-10-13

## 2022-04-27 MED ORDER — METFORMIN HCL ER 500 MG PO TB24
500 MG | ORAL_TABLET | Freq: Two times a day (BID) | ORAL | 1 refills | Status: DC
Start: 2022-04-27 — End: 2022-10-13

## 2022-04-27 MED ORDER — CITALOPRAM HYDROBROMIDE 40 MG PO TABS
40 MG | ORAL_TABLET | Freq: Every day | ORAL | 1 refills | Status: DC
Start: 2022-04-27 — End: 2022-10-13

## 2022-04-27 MED ORDER — SITAGLIPTIN PHOSPHATE 100 MG PO TABS
100 MG | ORAL_TABLET | Freq: Every day | ORAL | 1 refills | Status: DC
Start: 2022-04-27 — End: 2022-10-13

## 2022-04-27 MED ORDER — LANTUS SOLOSTAR 100 UNIT/ML SC SOPN
100 UNIT/ML | Freq: Every evening | SUBCUTANEOUS | 1 refills | Status: DC
Start: 2022-04-27 — End: 2022-10-13

## 2022-04-27 MED ORDER — LISINOPRIL 10 MG PO TABS
10 MG | ORAL_TABLET | Freq: Every day | ORAL | 1 refills | Status: DC
Start: 2022-04-27 — End: 2022-10-13

## 2022-04-27 MED ORDER — ROSUVASTATIN CALCIUM 40 MG PO TABS
40 MG | ORAL_TABLET | Freq: Every evening | ORAL | 1 refills | Status: DC
Start: 2022-04-27 — End: 2022-10-13

## 2022-04-27 NOTE — Progress Notes (Signed)
Sandy King (DOB:  [birth year]) is a Established patient, presenting virtually for evaluation of the following: Diabetes follow up and refills.     Assessment & Plan   Below is the assessment and plan developed based on review of pertinent history, physical exam, labs, studies, and medications.    1. Type 2 diabetes mellitus with hyperglycemia, with long-term current use of insulin (HCC)  -     Semaglutide, 2 MG/DOSE, (OZEMPIC, 2 MG/DOSE,) 8 MG/3ML SOPN; Inject 2 mg into the skin once a week, Disp-24 mL, R-1Normal  -     lisinopril (PRINIVIL;ZESTRIL) 10 MG tablet; Take 1 tablet by mouth daily, Disp-90 tablet, R-1Normal  -     metFORMIN (GLUCOPHAGE-XR) 500 MG extended release tablet; Take 2 tablets by mouth 2 times daily, Disp-360 tablet, R-1Normal  -     SITagliptin (JANUVIA) 100 MG tablet; Take 1 tablet by mouth daily, Disp-90 tablet, R-1Normal  -     insulin glargine (LANTUS SOLOSTAR) 100 UNIT/ML injection pen; Inject 44 Units into the skin nightly, Disp-15 Adjustable Dose Pre-filled Pen Syringe, R-1Normal  2. Class 3 severe obesity due to excess calories with serious comorbidity and body mass index (BMI) of 40.0 to 44.9 in adult (HCC)  -     Semaglutide, 2 MG/DOSE, (OZEMPIC, 2 MG/DOSE,) 8 MG/3ML SOPN; Inject 2 mg into the skin once a week, Disp-24 mL, R-1Normal  3. Essential (primary) hypertension  -     amLODIPine (NORVASC) 10 MG tablet; Take 1 tablet by mouth daily, Disp-90 tablet, R-1Normal  -     lisinopril (PRINIVIL;ZESTRIL) 10 MG tablet; Take 1 tablet by mouth daily, Disp-90 tablet, R-1Normal  4. Generalized anxiety disorder  -     citalopram (CELEXA) 40 MG tablet; Take 1 tablet by mouth daily, Disp-90 tablet, R-1Normal  5. Moderate mixed hyperlipidemia not requiring statin therapy  -     rosuvastatin (CRESTOR) 40 MG tablet; Take 1 tablet by mouth nightly, Disp-90 tablet, R-1Normal  6. Microalbuminuria  Comments:  Continue ace inhibitor (ace inhibtor).     Return in about 3 months (around 07/28/2022) for  DM, HTN, HLD.       Subjective   T2DM: Fasting <135. She's noticed great improvement since starting ozempic.    Patient takes metformin 1000mg  XR once a day, januvia 100mg  daily, she was on lantus 32u nightly (up to 44u nightly now).  Her meter has been giving he error messages. She currently has Contour Next Easy. She has not been able to check her blood sugars "in a while".  I sent for freestyle , it is requiring prior authorization per patient.   Hemoglobin A1C       Date                     Value               Ref Range           Status                04/22/2022               8.1 (H)             4.8 - 5.6 %         Final              Comment:             Prediabetes: 5.7 - 6.4  Diabetes: >6.4           Glycemic control for adults with diabetes: <7.0    ----------  ACE inhibitor: taking   Last Lipid panel: UTD   Statin: taking   Last Microalbumin: present   Pneumonia vaccine 19-64?: reviewed   Last seen by opthalmology: reviewed    HTN: compliant with amlodipine and lisinopril. No cough.    HLD: Compliant with rosuvastatin without myalgias.     Anxiety: does well on celexa 40mg . Denies HI/SI.     Review of Systems   Constitutional:  Negative for chills and fever.   Respiratory:  Negative for cough and shortness of breath.    Cardiovascular:  Negative for chest pain and palpitations.        Objective   Patient-Reported Vitals  No data recorded     Physical Exam  Nursing note reviewed.   Constitutional:       General: She is not in acute distress.  Pulmonary:      Effort: Pulmonary effort is normal. No respiratory distress.   Neurological:      Mental Status: She is alert and oriented to person, place, and time. Mental status is at baseline.   Psychiatric:         Mood and Affect: Mood normal.         Behavior: Behavior normal.       Sandy King, was evaluated through a synchronous (real-time) audio-video encounter. The patient (or guardian if applicable) is aware that this is a billable  service, which includes applicable co-pays. This Virtual Visit was conducted with patient's (and/or legal guardian's) consent. Patient identification was verified, and a caregiver was present when appropriate.   The patient was located at Home: [place] [address]  [place]  Provider was located at [facility] (Appt Dept State): [facility]         --[place], MD

## 2022-05-19 ENCOUNTER — Other Ambulatory Visit: Payer: Self-pay

## 2022-05-19 ENCOUNTER — Encounter (HOSPITAL_BASED_OUTPATIENT_CLINIC_OR_DEPARTMENT_OTHER): Payer: Self-pay | Admitting: Emergency Medicine

## 2022-05-19 ENCOUNTER — Emergency Department (HOSPITAL_BASED_OUTPATIENT_CLINIC_OR_DEPARTMENT_OTHER)
Admission: EM | Admit: 2022-05-19 | Discharge: 2022-05-20 | Disposition: A | Discharge status: Home / Self Care | Payer: Medicaid Other | Attending: Emergency Medicine | Admitting: Emergency Medicine

## 2022-05-19 ENCOUNTER — Emergency Department (HOSPITAL_BASED_OUTPATIENT_CLINIC_OR_DEPARTMENT_OTHER): Payer: Medicaid Other

## 2022-05-19 DIAGNOSIS — X501XXA Overexertion from prolonged static or awkward postures, initial encounter: Secondary | ICD-10-CM | POA: Diagnosis not present

## 2022-05-19 DIAGNOSIS — Z7982 Long term (current) use of aspirin: Secondary | ICD-10-CM | POA: Diagnosis not present

## 2022-05-19 DIAGNOSIS — S6992XA Unspecified injury of left wrist, hand and finger(s), initial encounter: Secondary | ICD-10-CM | POA: Diagnosis present

## 2022-05-19 DIAGNOSIS — S63502A Unspecified sprain of left wrist, initial encounter: Secondary | ICD-10-CM | POA: Diagnosis not present

## 2022-05-19 NOTE — ED Triage Notes (Signed)
Formatting of this note might be different from the original.  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

## 2022-05-19 NOTE — Discharge Instructions (Signed)
Please use the wrist brace for comfort.  It may be exceptionally helpful at night as most people tend to forcefully flex their wrist with sleep.  Take 4 over the counter ibuprofen tablets 3 times a day or 2 over-the-counter naproxen tablets twice a day for pain. Also take tylenol 1000mg (2 extra strength) four times a day.

## 2022-05-19 NOTE — ED Triage Notes (Signed)
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

## 2022-05-20 NOTE — ED Provider Notes (Signed)
Formatting of this note is different from the original.  Images from the original note were not included.    [facility] EMERGENCY DEPARTMENT  Provider Note    CSN: [id number]  Arrival date & time: 05/19/22  2126        History    Chief Complaint   Patient presents with    Wrist Pain     Sandy King is a 46 y.o. female.    46 yo F with a chief complaint of left wrist pain.  This been going on for about a week.  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  Lots of popping and clicking to the lateral aspect of the wrist.  Denies trauma.  Denies fevers denies redness.    Wrist Pain        Home Medications  Prior to Admission medications    Medication Sig Start Date End Date Taking? Authorizing Provider   amLODipine (NORVASC) 5 MG tablet Take 5 mg by mouth daily.    [provider]   aspirin-acetaminophen-caffeine (EXCEDRIN MIGRAINE) [phone] MG tablet Take by mouth every 6 (six) hours as needed for headache.    [provider]   benzonatate (TESSALON PERLES) 100 MG capsule Take 1 capsule (100 mg total) by mouth 3 (three) times daily as needed for cough. 11/10/18   Aviva Kluver B, PA-C   citalopram (CELEXA) 10 MG tablet Take 10 mg daily by mouth.    [provider]   glipiZIDE (GLUCOTROL) 10 MG tablet Take 10 mg by mouth 2 (two) times daily before a meal.    [provider]   guaiFENesin (MUCINEX) 600 MG 12 hr tablet Take 1 tablet (600 mg total) by mouth 2 (two) times daily. 11/10/18   Aviva Kluver B, PA-C   lidocaine (XYLOCAINE) 5 % ointment Apply 1 application topically as needed. 01/10/22   Achille Rich, PA-C   liraglutide (VICTOZA) 18 MG/3ML SOPN Inject into the skin.    [provider]   metFORMIN (GLUCOPHAGE) 1000 MG tablet Take 1,000 mg by mouth 2 (two) times daily with a meal.    [provider]   methocarbamol (ROBAXIN) 500 MG tablet Take 1 tablet (500 mg total) by mouth 2 (two)  times daily. 09/23/21   Melene Plan, DO   oseltamivir (TAMIFLU) 75 MG capsule Take 1 capsule (75 mg total) by mouth every 12 (twelve) hours. 01/15/17   Lurene Shadow, PA-C   phenazopyridine (PYRIDIUM) 200 MG tablet Take 1 tablet (200 mg total) by mouth 3 (three) times daily. 03/21/20   Pollyann Savoy, MD   Rizatriptan Benzoate (MAXALT PO) Take by mouth.    [provider]   Semaglutide (OZEMPIC, 0.25 OR 0.5 MG/DOSE, [place]) Inject into the skin.    [provider]   Topiramate (TOPAMAX PO) Take by mouth.    [provider]       Allergies     Codeine and Percocet [oxycodone-acetaminophen]      Review of Systems    Review of Systems    Physical Exam  Updated Vital Signs  BP (!) 144/98 (BP Location: Right Arm)   Pulse 95   Temp 98.7 F (37.1 C) (Oral)   Resp 18   Ht 5\' 8"  (1.727 m)   Wt 122.5 kg   LMP 04/28/2022 (Exact Date)   SpO2 99%   BMI 41.05 kg/m  Physical Exam  Vitals and nursing note reviewed.   Constitutional:       General: She is not in acute distress.     Appearance: She is well-developed. She is not diaphoretic.   HENT:      Head: Normocephalic and atraumatic.   Eyes:      Pupils: Pupils are equal, round, and reactive to light.   Cardiovascular:      Rate and Rhythm: Normal rate and regular rhythm.      Heart sounds: No murmur heard.     No friction rub. No gallop.   Pulmonary:      Effort: Pulmonary effort is normal.      Breath sounds: No wheezing or rales.   Abdominal:      General: There is no distension.      Palpations: Abdomen is soft.      Tenderness: There is no abdominal tenderness.   Musculoskeletal:         General: Tenderness present.      Cervical back: Normal range of motion and neck supple.      Comments: Tenderness about the lateral aspect of the wrist with some popping and clicking with passive range of motion.  Pulse motor and sensation intact distally.  No pain at the elbow.  Able to supinate and pronate.   Skin:     General: Skin is warm and dry.    Neurological:      Mental Status: She is alert and oriented to person, place, and time.   Psychiatric:         Behavior: Behavior normal.     ED Results / Procedures / Treatments    Labs  (all labs ordered are listed, but only abnormal results are displayed)  Labs Reviewed - No data to display    EKG  None    Radiology  DG Wrist Complete Left    Result Date: 05/19/2022  CLINICAL DATA:  Left wrist pain. EXAM: LEFT WRIST - COMPLETE 3+ VIEW COMPARISON:  None Available. FINDINGS: There is no evidence of fracture or dislocation. Normal joint spaces and alignment. There is no evidence of arthropathy or other focal bone abnormality. Soft tissues are unremarkable. IMPRESSION: Negative radiographs of the left wrist. Electronically Signed   By: Narda Rutherford M.D.   On: 05/19/2022 23:47      Procedures  Procedures     Medications Ordered in ED  Medications - No data to display    ED Course/ Medical Decision Making/ A&P      Medical Decision Making  Amount and/or Complexity of Data Reviewed  Radiology: ordered.    46 yo F with a chief complaints of left wrist pain.  This mostly on the lateral aspect of the wrist and associated with some popping and clicking.  Plain film independently interpreted by me without fracture or dislocation.  We will give a removable brace for comfort.  PCP follow-up.    2:12 AM:  I have discussed the diagnosis/risks/treatment options with the patient.  Evaluation and diagnostic testing in the emergency department does not suggest an emergent condition requiring admission or immediate intervention beyond what has been performed at this time.  They will follow up with  PCP. We also discussed returning to the ED immediately if new or worsening sx occur. We discussed the sx which are most concerning (e.g., sudden worsening pain, fever, inability to tolerate by mouth) that necessitate immediate return. Medications administered to the patient during their visit and any  new prescriptions provided to the  patient are listed below.    Medications given during this visit  Medications - No data to display    The patient appears reasonably screen and/or stabilized for discharge and I doubt any other medical condition or other [facility] requiring further screening, evaluation, or treatment in the ED at this time prior to discharge.     Final Clinical Impression(s) / ED Diagnoses  Final diagnoses:   Sprain of left wrist, initial encounter     Rx / DC Orders  ED Discharge Orders       None           Melene Plan, DO  05/20/22 9977    Electronically signed by Melene Plan, DO at 05/20/2022  2:12 AM EDT

## 2022-05-20 NOTE — ED Provider Notes (Signed)
[facility] EMERGENCY DEPARTMENT Provider Note   CSN: [id number] Arrival date & time: 05/19/22  2126     History  Chief Complaint  Patient presents with   Wrist Pain    Cynthia Byrd is a 46 y.o. female.  46 yo F with a chief complaint of left wrist pain.  This been going on for about a week.  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  Lots of popping and clicking to the lateral aspect of the wrist.  Denies trauma.  Denies fevers denies redness.   Wrist Pain       Home Medications Prior to Admission medications   Medication Sig Start Date End Date Taking? Authorizing Provider  amLODipine (NORVASC) 5 MG tablet Take 5 mg by mouth daily.    [provider]  aspirin-acetaminophen-caffeine (EXCEDRIN MIGRAINE) [phone] MG tablet Take by mouth every 6 (six) hours as needed for headache.    [provider]  benzonatate (TESSALON PERLES) 100 MG capsule Take 1 capsule (100 mg total) by mouth 3 (three) times daily as needed for cough. 11/10/18   Aviva Kluver B, PA-C  citalopram (CELEXA) 10 MG tablet Take 10 mg daily by mouth.    [provider]  glipiZIDE (GLUCOTROL) 10 MG tablet Take 10 mg by mouth 2 (two) times daily before a meal.    [provider]  guaiFENesin (MUCINEX) 600 MG 12 hr tablet Take 1 tablet (600 mg total) by mouth 2 (two) times daily. 11/10/18   Aviva Kluver B, PA-C  lidocaine (XYLOCAINE) 5 % ointment Apply 1 application topically as needed. 01/10/22   Achille Rich, PA-C  liraglutide (VICTOZA) 18 MG/3ML SOPN Inject into the skin.    [provider]  metFORMIN (GLUCOPHAGE) 1000 MG tablet Take 1,000 mg by mouth 2 (two) times daily with a meal.    [provider]  methocarbamol (ROBAXIN) 500 MG tablet Take 1 tablet (500 mg total) by mouth 2 (two) times daily. 09/23/21   Melene Plan, DO  oseltamivir (TAMIFLU) 75 MG capsule Take 1 capsule (75  mg total) by mouth every 12 (twelve) hours. 01/15/17   Lurene Shadow, PA-C  phenazopyridine (PYRIDIUM) 200 MG tablet Take 1 tablet (200 mg total) by mouth 3 (three) times daily. 03/21/20   Pollyann Savoy, MD  Rizatriptan Benzoate (MAXALT PO) Take by mouth.    [provider]  Semaglutide (OZEMPIC, 0.25 OR 0.5 MG/DOSE, [place]) Inject into the skin.    [provider]  Topiramate (TOPAMAX PO) Take by mouth.    [provider]      Allergies    Codeine and Percocet [oxycodone-acetaminophen]    Review of Systems   Review of Systems  Physical Exam Updated Vital Signs BP (!) 144/98 (BP Location: Right Arm)   Pulse 95   Temp 98.7 F (37.1 C) (Oral)   Resp 18   Ht 5\' 8"  (1.727 m)   Wt 122.5 kg   LMP 04/28/2022 (Exact Date)   SpO2 99%   BMI 41.05 kg/m  Physical Exam Vitals and nursing note reviewed.  Constitutional:      General: She is not in acute distress.    Appearance: She is well-developed. She is not diaphoretic.  HENT:     Head: Normocephalic and atraumatic.  Eyes:     Pupils: Pupils are equal, round, and reactive to light.  Cardiovascular:  Rate and Rhythm: Normal rate and regular rhythm.     Heart sounds: No murmur heard.    No friction rub. No gallop.  Pulmonary:     Effort: Pulmonary effort is normal.     Breath sounds: No wheezing or rales.  Abdominal:     General: There is no distension.     Palpations: Abdomen is soft.     Tenderness: There is no abdominal tenderness.  Musculoskeletal:        General: Tenderness present.     Cervical back: Normal range of motion and neck supple.     Comments: Tenderness about the lateral aspect of the wrist with some popping and clicking with passive range of motion.  Pulse motor and sensation intact distally.  No pain at the elbow.  Able to supinate and pronate.  Skin:    General: Skin is warm and dry.  Neurological:     Mental Status: She is alert and oriented to person, place, and time.   Psychiatric:        Behavior: Behavior normal.     ED Results / Procedures / Treatments   Labs (all labs ordered are listed, but only abnormal results are displayed) Labs Reviewed - No data to display  EKG None  Radiology DG Wrist Complete Left  Result Date: 05/19/2022 CLINICAL DATA:  Left wrist pain. EXAM: LEFT WRIST - COMPLETE 3+ VIEW COMPARISON:  None Available. FINDINGS: There is no evidence of fracture or dislocation. Normal joint spaces and alignment. There is no evidence of arthropathy or other focal bone abnormality. Soft tissues are unremarkable. IMPRESSION: Negative radiographs of the left wrist. Electronically Signed   By: Narda Rutherford M.D.   On: 05/19/2022 23:47    Procedures Procedures    Medications Ordered in ED Medications - No data to display  ED Course/ Medical Decision Making/ A&P                           Medical Decision Making Amount and/or Complexity of Data Reviewed Radiology: ordered.   46 yo F with a chief complaints of left wrist pain.  This mostly on the lateral aspect of the wrist and associated with some popping and clicking.  Plain film independently interpreted by me without fracture or dislocation.  We will give a removable brace for comfort.  PCP follow-up.  2:12 AM:  I have discussed the diagnosis/risks/treatment options with the patient.  Evaluation and diagnostic testing in the emergency department does not suggest an emergent condition requiring admission or immediate intervention beyond what has been performed at this time.  They will follow up with  PCP. We also discussed returning to the ED immediately if new or worsening sx occur. We discussed the sx which are most concerning (e.g., sudden worsening pain, fever, inability to tolerate by mouth) that necessitate immediate return. Medications administered to the patient during their visit and any new prescriptions provided to the patient are listed below.  Medications given during this  visit Medications - No data to display   The patient appears reasonably screen and/or stabilized for discharge and I doubt any other medical condition or other [facility] requiring further screening, evaluation, or treatment in the ED at this time prior to discharge.          Final Clinical Impression(s) / ED Diagnoses Final diagnoses:  Sprain of left wrist, initial encounter    Rx / DC Orders ED Discharge Orders  None         Deno Etienne, [place] 05/20/22 [phone]

## 2022-10-13 ENCOUNTER — Telehealth

## 2022-10-13 MED ORDER — AMLODIPINE BESYLATE 10 MG PO TABS
10 MG | ORAL_TABLET | Freq: Every day | ORAL | 0 refills | Status: AC
Start: 2022-10-13 — End: 2023-01-10

## 2022-10-13 MED ORDER — ROSUVASTATIN CALCIUM 40 MG PO TABS
40 MG | ORAL_TABLET | Freq: Every evening | ORAL | 0 refills | Status: AC
Start: 2022-10-13 — End: 2023-01-10

## 2022-10-13 MED ORDER — SITAGLIPTIN PHOSPHATE 100 MG PO TABS
100 MG | ORAL_TABLET | Freq: Every day | ORAL | 0 refills | Status: AC
Start: 2022-10-13 — End: 2023-01-10

## 2022-10-13 MED ORDER — LANTUS SOLOSTAR 100 UNIT/ML SC SOPN
100 UNIT/ML | Freq: Every evening | SUBCUTANEOUS | 0 refills | Status: AC
Start: 2022-10-13 — End: 2023-01-10

## 2022-10-13 MED ORDER — CITALOPRAM HYDROBROMIDE 40 MG PO TABS
40 MG | ORAL_TABLET | Freq: Every day | ORAL | 0 refills | Status: AC
Start: 2022-10-13 — End: 2023-01-10

## 2022-10-13 MED ORDER — METFORMIN HCL ER 500 MG PO TB24
500 MG | ORAL_TABLET | Freq: Two times a day (BID) | ORAL | 0 refills | Status: AC
Start: 2022-10-13 — End: 2023-01-10

## 2022-10-13 MED ORDER — OZEMPIC (2 MG/DOSE) 8 MG/3ML SC SOPN
8 MG/3ML | SUBCUTANEOUS | 0 refills | Status: AC
Start: 2022-10-13 — End: 2023-01-10

## 2022-10-13 MED ORDER — LISINOPRIL 10 MG PO TABS
10 MG | ORAL_TABLET | Freq: Every day | ORAL | 0 refills | Status: AC
Start: 2022-10-13 — End: 2023-01-10

## 2022-10-13 NOTE — Telephone Encounter (Signed)
-----   Message from [place] sent at 10/13/2022 10:36 AM EST -----  Subject: Message to Provider    QUESTIONS  Information for Provider? Patient is calling because she was a former   patient of Dr. Melina Modena. She would like to know if she could become a patient   of Dr. Ena Dawley No answer from the office. Please contact patient to let her   know. Please advise.  ---------------------------------------------------------------------------  --------------  Rod Can INFO  [phone]; OK to leave message on voicemail  ---------------------------------------------------------------------------  --------------  SCRIPT ANSWERS  Relationship to Patient? Self

## 2022-10-13 NOTE — Addendum Note (Signed)
Addended byOlive Bass on: 10/13/2022 12:41 PM     Modules accepted: Orders

## 2022-10-13 NOTE — Telephone Encounter (Signed)
Sandy King is requesting a refill on insulin glargine, sitagliptin, rosuvastatin, metformin, lisinopril, citalopram, amlodipine, ozempic.     Patient's last appointment was 04/27/2022  Next visit is scheduled for 01/10/2023   Please send me  [facility] [address], [place] - [address] - P [phone] - F [phone]  [place] [address]  Phone: [phone] Fax: [phone]

## 2023-01-10 ENCOUNTER — Ambulatory Visit
Admit: 2023-01-10 | Discharge: 2023-01-10 | Payer: PRIVATE HEALTH INSURANCE | Attending: Family | Primary: Family Medicine

## 2023-01-10 DIAGNOSIS — Z1231 Encounter for screening mammogram for malignant neoplasm of breast: Secondary | ICD-10-CM

## 2023-01-10 MED ORDER — CITALOPRAM HYDROBROMIDE 40 MG PO TABS
40 MG | ORAL_TABLET | Freq: Every day | ORAL | 1 refills | Status: AC
Start: 2023-01-10 — End: 2023-05-24

## 2023-01-10 MED ORDER — AMLODIPINE BESYLATE 10 MG PO TABS
10 MG | ORAL_TABLET | Freq: Every day | ORAL | 1 refills | Status: AC
Start: 2023-01-10 — End: 2023-05-24

## 2023-01-10 MED ORDER — LANTUS SOLOSTAR 100 UNIT/ML SC SOPN
100 UNIT/ML | Freq: Every evening | SUBCUTANEOUS | 0 refills | Status: DC
Start: 2023-01-10 — End: 2023-03-23

## 2023-01-10 MED ORDER — SITAGLIPTIN PHOSPHATE 100 MG PO TABS
100 MG | ORAL_TABLET | Freq: Every day | ORAL | 1 refills | Status: AC
Start: 2023-01-10 — End: 2023-05-24

## 2023-01-10 MED ORDER — ROSUVASTATIN CALCIUM 40 MG PO TABS
40 MG | ORAL_TABLET | Freq: Every evening | ORAL | 1 refills | Status: AC
Start: 2023-01-10 — End: 2023-05-24

## 2023-01-10 MED ORDER — METFORMIN HCL ER 500 MG PO TB24
500 MG | ORAL_TABLET | Freq: Two times a day (BID) | ORAL | 1 refills | Status: AC
Start: 2023-01-10 — End: 2023-05-24

## 2023-01-10 MED ORDER — LISINOPRIL 10 MG PO TABS
10 MG | ORAL_TABLET | Freq: Every day | ORAL | 1 refills | Status: AC
Start: 2023-01-10 — End: 2023-05-24

## 2023-01-10 NOTE — Progress Notes (Signed)
Chief Complaint   Patient presents with    Follow-up     BP (!) 164/96 (Site: Right Lower Arm, Position: Sitting)   Pulse 89   Temp 98.3 F (36.8 C) (Oral)   Resp 16   Ht 1.727 m (5\' 8" )   Wt 130.2 kg (287 lb)   SpO2 99%   BMI 43.64 kg/m   "Have you been to the ER, urgent care clinic since your last visit?  Hospitalized since your last visit?"    NO    "Have you seen or consulted any other health care providers outside of [place] since your last visit?"    NO    "Have you had a colorectal cancer screening such as a colonoscopy/FIT/Cologuard?    NO     Have you had a mammogram?"   NO

## 2023-01-10 NOTE — Progress Notes (Signed)
Sandy King is a 47 y.o. female who was seen in clinic today (01/10/2023).    Assessment & Plan:   Below is the assessment and plan developed based on review of pertinent history, physical exam, labs, studies, and medications.    1. Screening mammogram, encounter for  -     MAM DIGITAL SCREEN W OR WO CAD BILATERAL; Future  2. Type 2 diabetes mellitus with hyperglycemia, with long-term current use of insulin (HCC)  Comments:  unclear control.   consider mounjaro with coupon card given high deductible  Orders:  -     TSH + Free T4 Panel  -     Comprehensive Metabolic Panel  -     CBC  -     Lipid Panel  -     Hemoglobin A1C  -     Microalbumin / Creatinine Urine Ratio  -     metFORMIN (GLUCOPHAGE-XR) 500 MG extended release tablet; Take 2 tablets by mouth 2 times daily, Disp-360 tablet, R-1Normal  -     lisinopril (PRINIVIL;ZESTRIL) 10 MG tablet; Take 1 tablet by mouth daily, Disp-90 tablet, R-1Normal  -     insulin glargine (LANTUS SOLOSTAR) 100 UNIT/ML injection pen; Inject 44 Units into the skin nightly, Disp-15 Adjustable Dose Pre-filled Pen Syringe, R-0Normal  -     SITagliptin (JANUVIA) 100 MG tablet; Take 1 tablet by mouth daily, Disp-90 tablet, R-1Normal  3. Essential (primary) hypertension  Comments:  elevated today, has had issues with compliance over the past few days.   Will start to monitor at home and take meds daily  Orders:  -     amLODIPine (NORVASC) 10 MG tablet; Take 1 tablet by mouth daily, Disp-90 tablet, R-1Normal  -     lisinopril (PRINIVIL;ZESTRIL) 10 MG tablet; Take 1 tablet by mouth daily, Disp-90 tablet, R-1Normal  4. Generalized anxiety disorder  Comments:  stable on celexa  Orders:  -     citalopram (CELEXA) 40 MG tablet; Take 1 tablet by mouth daily, Disp-90 tablet, R-1Normal  5. Mixed hyperlipidemia  Comments:  due for fasting labs, taking crestor  Orders:  -     rosuvastatin (CRESTOR) 40 MG tablet; Take 1 tablet by mouth nightly, Disp-90 tablet, R-1Normal  6. Class 3 severe obesity  due to excess calories with serious comorbidity and body mass index (BMI) of 40.0 to 44.9 in adult [facility])  Comments:  continue to work on diet and exercise for overall wellbeing  Orders:  -     Vitamin D 25 Hydroxy  7. Enlarged thyroid  Comments:  will check [place]  Orders:  -     [place] HEAD NECK SOFT TISSUE THYROID; Future      No follow-ups on file.    Subjective:   Sandy King was seen today for Follow-up     Diabetes: Last A1c was   Hemoglobin A1C   Date Value Ref Range Status   04/22/2022 8.1 (H) 4.8 - 5.6 % Final     Comment:              Prediabetes: 5.7 - 6.4           Diabetes: >6.4           Glycemic control for adults with diabetes: <7.0      Patient reported that she has been off of ozempic since her insurance does not cover this and it is high deductible plan for her to pay.   Patient is using metformin, [place]  and lantus.  Notes that glucose levels are elevated at home.   Up to date on eye exam, no diabetic retinopathy, does wear glasses.     Hypertension: Patients hypertension is well controlled on regimen of lisinopril and amlodipine Denies headaches, blurred vision or dizziness.      Hyperlipidemia: Mixed hyperlipidemia well controlled on crestor.   Denies any leg cramps or malaise from this medication.  Reported compliance with taking medication daily.     Depression/Anxiety:  Patient notes that their mood is well controlled on celexa.   Is seeing a therapist, Dr. Micah King    Patient reported that she is fatigued, very tired and sleepy.   Does get burst of energy to be able to get tasks done.    Patient is having regular menstrual cycles.     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  Has a son who is 59.    Review of Systems   Constitutional:  Negative for fatigue.   Eyes:  Negative for visual disturbance.   Respiratory:  Negative for chest tightness and shortness of breath.    Cardiovascular:  Negative for chest pain, palpitations and leg swelling.   Allergic/Immunologic: Negative  for environmental allergies.   Neurological:  Negative for dizziness, weakness, light-headedness and headaches.   Psychiatric/Behavioral:  Negative for sleep disturbance. The patient is not nervous/anxious.           Objective:     Vitals:    01/10/23 1410   BP: (!) 164/96   Site: Right Lower Arm   Position: Sitting   Pulse: 89   Resp: 16   Temp: 98.3 F (36.8 C)   TempSrc: Oral   SpO2: 99%   Weight: 130.2 kg (287 lb)   Height: 1.727 m (5\' 8" )      Body mass index is 43.64 kg/m.     Physical Exam  Constitutional:       Appearance: Normal appearance. She is obese.   HENT:      Head: Normocephalic.   Eyes:      Conjunctiva/sclera: Conjunctivae normal.   Neck:      Thyroid: Thyromegaly present.   Cardiovascular:      Rate and Rhythm: Normal rate and regular rhythm.   Pulmonary:      Effort: Pulmonary effort is normal.      Breath sounds: Normal breath sounds.   Musculoskeletal:      Cervical back: Normal range of motion.   Skin:     General: Skin is warm and dry.   Neurological:      Mental Status: She is alert and oriented to person, place, and time.   Psychiatric:         Mood and Affect: Mood normal.         Behavior: Behavior normal.          Allergies   Allergen Reactions    Codeine Other (See Comments)     Dizziness/delirium    Metformin Diarrhea    Oxycodone-Acetaminophen Other (See Comments)     dizziness       Current Outpatient Medications   Medication Sig Dispense Refill    amLODIPine (NORVASC) 10 MG tablet Take 1 tablet by mouth daily 90 tablet 1    citalopram (CELEXA) 40 MG tablet Take 1 tablet by mouth daily 90 tablet 1    rosuvastatin (CRESTOR) 40 MG tablet Take 1 tablet by mouth nightly 90 tablet 1  metFORMIN (GLUCOPHAGE-XR) 500 MG extended release tablet Take 2 tablets by mouth 2 times daily 360 tablet 1    lisinopril (PRINIVIL;ZESTRIL) 10 MG tablet Take 1 tablet by mouth daily 90 tablet 1    insulin glargine (LANTUS SOLOSTAR) 100 UNIT/ML injection pen Inject 44 Units into the skin nightly 15  Adjustable Dose Pre-filled Pen Syringe 0    SITagliptin (JANUVIA) 100 MG tablet Take 1 tablet by mouth daily 90 tablet 1    Rimegepant Sulfate (NURTEC) 75 MG TBDP Take 75 mg by mouth every other day       No current facility-administered medications for this visit.       Reviewed and updated this visit:  Tobacco  Allergies  Meds  Problems  Med Hx  Surg Hx  Soc Hx  Fam Hx     Elyse Hsu, APRN - NP

## 2023-01-10 NOTE — Patient Instructions (Signed)
If the scheduling team hasn't reached out within 2 business days, you can call them at [phone] to get scheduled for your test.

## 2023-01-11 LAB — COMPREHENSIVE METABOLIC PANEL
ALT: 6 IU/L (ref 0–32)
AST: 10 IU/L (ref 0–40)
Albumin/Globulin Ratio: 1.7 (ref 1.2–2.2)
Albumin: 4.5 g/dL (ref 3.9–4.9)
Alkaline Phosphatase: 63 IU/L (ref 44–121)
BUN/Creatinine Ratio: 10 (ref 9–23)
BUN: 8 mg/dL (ref 6–24)
CO2: 21 mmol/L (ref 20–29)
Calcium: 9.2 mg/dL (ref 8.7–10.2)
Chloride: 96 mmol/L (ref 96–106)
Creatinine: 0.82 mg/dL (ref 0.57–1.00)
Est, Glomerular Filtration Rate: 89 mL/min/{1.73_m2} (ref >59)
Globulin, Total: 2.6 g/dL (ref 1.5–4.5)
Glucose: 295 mg/dL — ABNORMAL HIGH (ref 70–99)
Potassium: 3.9 mmol/L (ref 3.5–5.2)
Sodium: 134 mmol/L (ref 134–144)
Total Bilirubin: 0.5 mg/dL (ref 0.0–1.2)
Total Protein: 7.1 g/dL (ref 6.0–8.5)

## 2023-01-11 LAB — LIPID PANEL
Cholesterol: 133 mg/dL (ref 100–199)
HDL: 37 mg/dL — ABNORMAL LOW (ref >39)
LDL Calculated: 79 mg/dL (ref 0–99)
Triglycerides: 86 mg/dL (ref 0–149)
VLDL Cholesterol Calculated: 17 mg/dL (ref 5–40)

## 2023-01-11 LAB — TSH + FREE T4 PANEL
T4 Free: 0.99 ng/dL (ref 0.82–1.77)
TSH: 1.12 u[IU]/mL (ref 0.450–4.500)

## 2023-01-11 LAB — CBC
Hematocrit: 39.9 % (ref 34.0–46.6)
Hemoglobin: 12.7 g/dL (ref 11.1–15.9)
MCH: 24.6 pg — ABNORMAL LOW (ref 26.6–33.0)
MCHC: 31.8 g/dL (ref 31.5–35.7)
MCV: 77 fL — ABNORMAL LOW (ref 79–97)
Platelets: 270 10*3/uL (ref 150–450)
RBC: 5.17 x10E6/uL (ref 3.77–5.28)
RDW: 14.3 % (ref 11.7–15.4)
WBC: 7.8 10*3/uL (ref 3.4–10.8)

## 2023-01-11 LAB — VITAMIN D 25 HYDROXY: Vit D, 25-Hydroxy: 8.8 ng/mL — ABNORMAL LOW (ref 30.0–100.0)

## 2023-01-11 LAB — HEMOGLOBIN A1C: Hemoglobin A1C: 11.8 % — ABNORMAL HIGH (ref 4.8–5.6)

## 2023-01-11 MED ORDER — MOUNJARO 2.5 MG/0.5ML SC SOPN
2.50.5 MG/0.5ML | SUBCUTANEOUS | 0 refills | Status: DC
Start: 2023-01-11 — End: 2023-02-21

## 2023-01-11 MED ORDER — TIRZEPATIDE 2.5 MG/0.5ML SC SOPN
2.5 | SUBCUTANEOUS | Status: DC
Start: 2023-01-11 — End: 2023-01-11

## 2023-01-11 MED ORDER — VITAMIN D (ERGOCALCIFEROL) 1.25 MG (50000 UT) PO CAPS
1.2550000 MG (50000 UT) | ORAL_CAPSULE | ORAL | 0 refills | Status: AC
Start: 2023-01-11 — End: 2023-04-05

## 2023-01-11 NOTE — Addendum Note (Signed)
Addended byOlive Bass on: 01/11/2023 01:58 PM     Modules accepted: Orders

## 2023-01-12 LAB — MICROALBUMIN / CREATININE URINE RATIO
Albumin, U: 210.5 ug/mL
Creatinine, Ur: 133.3 mg/dL
Microalbumin Creatinine Ratio: 158 mg/g creat — ABNORMAL HIGH (ref 0–29)

## 2023-01-20 ENCOUNTER — Encounter

## 2023-01-20 ENCOUNTER — Inpatient Hospital Stay: Admit: 2023-01-20 | Payer: PRIVATE HEALTH INSURANCE | Primary: Family Medicine

## 2023-01-20 ENCOUNTER — Inpatient Hospital Stay: Admit: 2023-01-20 | Payer: BLUE CROSS/BLUE SHIELD | Primary: Family Medicine

## 2023-01-20 DIAGNOSIS — E049 Nontoxic goiter, unspecified: Secondary | ICD-10-CM

## 2023-01-20 DIAGNOSIS — Z1231 Encounter for screening mammogram for malignant neoplasm of breast: Secondary | ICD-10-CM

## 2023-01-25 NOTE — Addendum Note (Signed)
Addended byOlive Bass on: 01/25/2023 12:43 PM     Modules accepted: Orders

## 2023-02-20 ENCOUNTER — Encounter

## 2023-02-21 MED ORDER — TIRZEPATIDE 5 MG/0.5ML SC SOPN
50.5 MG/0.ML | SUBCUTANEOUS | 0 refills | Status: AC
Start: 2023-02-21 — End: 2023-03-23

## 2023-03-21 ENCOUNTER — Encounter

## 2023-03-23 MED ORDER — MOUNJARO 5 MG/0.5ML SC SOPN
50.5 MG/0.ML | SUBCUTANEOUS | 0 refills | Status: AC
Start: 2023-03-23 — End: 2023-05-24

## 2023-03-23 MED ORDER — LANTUS SOLOSTAR 100 UNIT/ML SC SOPN
100 UNIT/ML | SUBCUTANEOUS | 0 refills | Status: AC
Start: 2023-03-23 — End: 2023-05-24

## 2023-04-08 ENCOUNTER — Encounter
Payer: PRIVATE HEALTH INSURANCE | Attending: Student in an Organized Health Care Education/Training Program | Primary: Family

## 2023-05-17 ENCOUNTER — Ambulatory Visit (INDEPENDENT_AMBULATORY_CARE_PROVIDER_SITE_OTHER): Payer: 59

## 2023-05-17 ENCOUNTER — Ambulatory Visit
Admission: EM | Admit: 2023-05-17 | Discharge: 2023-05-17 | Disposition: A | Discharge status: Home / Self Care | Payer: 59

## 2023-05-17 ENCOUNTER — Telehealth: Payer: Self-pay

## 2023-05-17 DIAGNOSIS — R110 Nausea: Secondary | ICD-10-CM | POA: Diagnosis not present

## 2023-05-17 DIAGNOSIS — G43009 Migraine without aura, not intractable, without status migrainosus: Secondary | ICD-10-CM

## 2023-05-17 DIAGNOSIS — M542 Cervicalgia: Secondary | ICD-10-CM

## 2023-05-17 MED ORDER — ONDANSETRON 8 MG PO TBDP
8.0000 mg | ORAL_TABLET | Freq: Once | ORAL | Status: AC
Start: 2023-05-17 — End: 2023-05-17
  Administered 2023-05-17: 8 mg via ORAL

## 2023-05-17 MED ORDER — ONDANSETRON 8 MG PO TBDP: 8.0000 mg | ORAL_TABLET | Freq: Three times a day (TID) | ORAL | 0 refills | Status: AC | PRN

## 2023-05-17 MED ORDER — METOCLOPRAMIDE HCL 5 MG/ML IJ SOLN
10.0000 mg | INTRAMUSCULAR | Status: AC
  Administered 2023-05-17: 10 mg via INTRAMUSCULAR

## 2023-05-17 MED ORDER — KETOROLAC TROMETHAMINE 60 MG/2ML IM SOLN
60.0000 mg | Freq: Once | INTRAMUSCULAR | Status: AC
Start: 2023-05-17 — End: 2023-05-17
  Administered 2023-05-17: 60 mg via INTRAMUSCULAR

## 2023-05-17 NOTE — Telephone Encounter (Signed)
Already discussed with PSR's via chat about getting in for an appointment next week.

## 2023-05-17 NOTE — Telephone Encounter (Signed)
Patient went to Urgent care today for the swelling of her feet and ankles and doctor told patient that they think it is because of her diabetes. Can't remember when was last time took mounjaro because it has been on back order and knows A1c is not on a good level. Patient is really concern because she follows through with every recommendation pcp has given her. Patient states that as soon as she starts walking she can notice the swelling.     Patient wants to know if she can get an appointment

## 2023-05-17 NOTE — Telephone Encounter (Signed)
Patient has been scheduled for next Tuesday. 

## 2023-05-17 NOTE — ED Provider Notes (Signed)
[facility]    CSN: [id number] Arrival date & time: 05/17/23  1208      History   Chief Complaint Chief Complaint  Patient presents with   Migraine   Neck Pain   Leg Swelling    HPI Cynthia Byrd is a 47 y.o. female.   HPI 47 year old female presents with migraine for 2 to 3 days.  Reports taking OTC Excedrin with minimal improvement.  Reports headache is currently 10 out of 10 and she has nausea.  Additionally, patient reports neck pain for 2 to 3 weeks and has been using OTC lidocaine with minimal improvement.  PMH significant for morbid obesity, T2DM without complication, HTN, and headache.   Past Medical History:  Diagnosis Date   Diabetes mellitus without complication (HCC)    Headache    Hypertension     There are no problems to display for this patient.   Past Surgical History:  Procedure Laterality Date   DENTAL SURGERY     FOOT SURGERY     TUBAL LIGATION      OB History   No obstetric history on file.      Home Medications    Prior to Admission medications   Medication Sig Start Date End Date Taking? Authorizing Provider  LANTUS SOLOSTAR 100 UNIT/ML Solostar Pen Inject into the skin. 12/18/21  Yes [provider]  ondansetron (ZOFRAN-ODT) 8 MG disintegrating tablet Take 1 tablet (8 mg total) by mouth every 8 (eight) hours as needed for nausea or vomiting. 05/17/23  Yes Trevor Iha, FNP  amLODipine (NORVASC) 5 MG tablet Take 5 mg by mouth daily.    [provider]  aspirin-acetaminophen-caffeine (EXCEDRIN MIGRAINE) [phone] MG tablet Take by mouth every 6 (six) hours as needed for headache.    [provider]  citalopram (CELEXA) 10 MG tablet Take 10 mg daily by mouth.    [provider]  lidocaine (XYLOCAINE) 5 % ointment Apply 1 application topically as needed. 01/10/22   Achille Rich, PA-C  metFORMIN (GLUCOPHAGE) 1000 MG tablet Take 1,000 mg by mouth 2 (two) times daily with a meal.    [provider]  methocarbamol (ROBAXIN) 500 MG tablet Take 1 tablet (500 mg total) by mouth 2 (two) times daily. 09/23/21   Melene Plan, DO  phenazopyridine (PYRIDIUM) 200 MG tablet Take 1 tablet (200 mg total) by mouth 3 (three) times daily. 03/21/20   Pollyann Savoy, MD  Rizatriptan Benzoate (MAXALT PO) Take by mouth.    [provider]    Family History Family History  Problem Relation Age of Onset   Hypertension Mother    Healthy Father     Social History Social History   Tobacco Use   Smoking status: Never   Smokeless tobacco: Never  Vaping Use   Vaping Use: Never used  Substance Use Topics   Alcohol use: Yes    Comment: occ   Drug use: No     Allergies   Codeine and Percocet [oxycodone-acetaminophen]   Review of Systems Review of Systems  Cardiovascular:  Positive for leg swelling.  Gastrointestinal:  Positive for nausea.  Musculoskeletal:  Positive for neck pain.  Neurological:  Positive for headaches.  All other systems reviewed and are negative.    Physical Exam Triage Vital Signs ED Triage Vitals [05/17/23 1227]  Enc Vitals Group     BP (!) 153/91     Pulse      Resp 16     Temp 98.2  F (36.8 C)     Temp src      SpO2 98 %     Weight      Height      Head Circumference      Peak Flow      Pain Score      Pain Loc      Pain Edu?      Excl. in GC?    No data found.  Updated Vital Signs BP (!) 153/91   Temp 98.2 F (36.8 C)   Resp 16   LMP 05/15/2023   SpO2 98%      Physical Exam Vitals and nursing note reviewed.  Constitutional:      General: She is not in acute distress.    Appearance: Normal appearance. She is obese. She is not toxic-appearing.  HENT:     Head: Normocephalic and atraumatic.     Right Ear: Tympanic membrane, ear canal and external ear normal.     Left Ear: Tympanic membrane, ear canal and external ear normal.     Mouth/Throat:     Mouth: Mucous membranes are moist.     Pharynx: Oropharynx is  clear.  Eyes:     Extraocular Movements: Extraocular movements intact.     Conjunctiva/sclera: Conjunctivae normal.     Pupils: Pupils are equal, round, and reactive to light.  Cardiovascular:     Rate and Rhythm: Normal rate and regular rhythm.     Pulses: Normal pulses.     Heart sounds: Normal heart sounds.  Pulmonary:     Effort: Pulmonary effort is normal.     Breath sounds: Normal breath sounds. No wheezing, rhonchi or rales.  Musculoskeletal:        General: Normal range of motion.     Cervical back: Normal range of motion and neck supple.  Skin:    General: Skin is warm and dry.  Neurological:     General: No focal deficit present.     Mental Status: She is alert and oriented to person, place, and time. Mental status is at baseline.  Psychiatric:        Mood and Affect: Mood normal.        Behavior: Behavior normal.      UC Treatments / Results  Labs (all labs ordered are listed, but only abnormal results are displayed) Labs Reviewed - No data to display  EKG   Radiology DG Cervical Spine Complete  Result Date: 05/17/2023 CLINICAL DATA:  Neck pain EXAM: CERVICAL SPINE - COMPLETE 4+ VIEW COMPARISON:  None Available. FINDINGS: There is no evidence of cervical spine fracture or prevertebral soft tissue swelling. Straightening of the cervical lordosis. No significant listhesis. Intervertebral disc heights are preserved. Mild bilateral foraminal crowding at the C4-5 level secondary to facet and uncovertebral spurring. IMPRESSION: No acute findings. Mild bilateral foraminal crowding at the C4-5 level secondary to facet and uncovertebral spurring. Electronically Signed   By: Duanne Guess D.O.   On: 05/17/2023 14:30    Procedures Procedures (including critical care time)  Medications Ordered in UC Medications  ketorolac (TORADOL) injection 60 mg (60 mg Intramuscular Given 05/17/23 1254)  metoCLOPramide (REGLAN) injection 10 mg (10 mg Intramuscular Given 05/17/23 1254)   ondansetron (ZOFRAN-ODT) disintegrating tablet 8 mg (8 mg Oral Given 05/17/23 1254)    Initial Impression / Assessment and Plan / UC Course  I have reviewed the triage vital signs and the nursing notes.  Pertinent labs & imaging results that were available  during my care of the patient were reviewed by me and considered in my medical decision making (see chart for details).     MDM: 1.  Migraine without aura and without status migrainous, not intractable-IM Toradol 60 mg given once in clinic, IM Reglan 10 mg given once in clinic both prior to discharge; 2.  Nausea-Zofran 8 mg given once in clinic, Rx'd Zofran 8 mg 3 times daily, as needed for nausea; 3.  Neck pain-cervical spine x-ray results revealed above.  Patient notified of x-ray results of neck and to follow-up with PCP if neck pain worsens.  Work note provided to patient prior to discharge per request.  Patient discharged home, hemodynamically stable. Final Clinical Impressions(s) / UC Diagnoses   Final diagnoses:  Migraine without aura and without status migrainosus, not intractable  Neck pain  Nausea     Discharge Instructions      Advised patient may take Zofran daily or as needed for nausea.  Encouraged increase daily water intake to 64 ounces per day while taking this medication.  Advised if symptoms worsen and/or unresolved please follow-up with PCP or here for further evaluation.     ED Prescriptions     Medication Sig Dispense Auth. Provider   ondansetron (ZOFRAN-ODT) 8 MG disintegrating tablet Take 1 tablet (8 mg total) by mouth every 8 (eight) hours as needed for nausea or vomiting. 24 tablet Trevor Iha, FNP      PDMP not reviewed this encounter.   Trevor Iha, FNP 05/17/23 1653

## 2023-05-17 NOTE — ED Triage Notes (Signed)
Pt presents to uc with co of migraine for 2-3 days she has been taking Excedrin with minimal improvement. Headache is 10/10 and she has nausea.   She also neck pain for 2-3 weeks with minimal improvement she has been using lidocaine, motrin and aleve.  She also reports  bilateral lower leg swelling for a few weeks [place] get [place] to them. Pt reports swelling comes and goes and is better if she is able to rest and hydrate

## 2023-05-17 NOTE — Discharge Instructions (Addendum)
Advised patient may take Zofran daily or as needed for nausea.  Encouraged increase daily water intake to 64 ounces per day while taking this medication.  Advised if symptoms worsen and/or unresolved please follow-up with PCP or here for further evaluation.

## 2023-05-21 ENCOUNTER — Encounter

## 2023-05-23 MED ORDER — VITAMIN D (ERGOCALCIFEROL) 1.25 MG (50000 UT) PO CAPS
1.25 | ORAL_CAPSULE | ORAL | 0 refills | Status: AC
Start: 2023-05-23 — End: 2023-08-15

## 2023-05-24 ENCOUNTER — Ambulatory Visit: Admit: 2023-05-24 | Discharge: 2023-05-24 | Payer: PRIVATE HEALTH INSURANCE | Attending: Family | Primary: Family

## 2023-05-24 DIAGNOSIS — I10 Essential (primary) hypertension: Secondary | ICD-10-CM

## 2023-05-24 MED ORDER — LANTUS SOLOSTAR 100 UNIT/ML SC SOPN
100 | Freq: Every evening | SUBCUTANEOUS | 1 refills | 58.50000 days | Status: DC
Start: 2023-05-24 — End: 2024-04-02

## 2023-05-24 MED ORDER — AMLODIPINE BESYLATE 10 MG PO TABS
10 | ORAL_TABLET | Freq: Every day | ORAL | 1 refills | Status: AC
Start: 2023-05-24 — End: ?

## 2023-05-24 MED ORDER — METFORMIN HCL ER 500 MG PO TB24
500 | ORAL_TABLET | Freq: Two times a day (BID) | ORAL | 1 refills | Status: AC
Start: 2023-05-24 — End: ?

## 2023-05-24 MED ORDER — LISINOPRIL-HYDROCHLOROTHIAZIDE 10-12.5 MG PO TABS
10-12.5 MG | ORAL_TABLET | Freq: Every day | ORAL | 1 refills | Status: DC
Start: 2023-05-24 — End: 2024-01-04

## 2023-05-24 MED ORDER — TIZANIDINE HCL 4 MG PO TABS
4 | ORAL_TABLET | Freq: Every evening | ORAL | 0 refills | Status: AC | PRN
Start: 2023-05-24 — End: ?

## 2023-05-24 MED ORDER — CITALOPRAM HYDROBROMIDE 40 MG PO TABS
40 MG | ORAL_TABLET | Freq: Every day | ORAL | 1 refills | Status: DC
Start: 2023-05-24 — End: 2024-01-04

## 2023-05-24 MED ORDER — ROSUVASTATIN CALCIUM 40 MG PO TABS
40 | ORAL_TABLET | Freq: Every evening | ORAL | 1 refills | Status: AC
Start: 2023-05-24 — End: ?

## 2023-05-24 NOTE — Progress Notes (Signed)
"  Have you been to the ER, urgent care clinic since your last visit?  Hospitalized since your last visit?"    NO    "Have you seen or consulted any other health care providers outside of [facility] since your last visit?"    YES - When: approximately 1  weeks ago.  Where and Why: Neck/ Head/ feet sweeling.    "Have you had a colorectal cancer screening such as a colonoscopy/FIT/Cologuard?    NO    No colonoscopy on file  No cologuard on file  No FIT/FOBT on file   No flexible sigmoidoscopy on file

## 2023-05-24 NOTE — Progress Notes (Signed)
Sandy King is a 47 y.o. female who was seen in clinic today (05/24/2023).    Assessment & Plan:   Below is the assessment and plan developed based on review of pertinent history, physical exam, labs, studies, and medications.    1. Essential (primary) hypertension  Comments:  not well controlled.  add on HCTZ to aid in BP management and leg swelling.  Orders:  -     lisinopril-hydroCHLOROthiazide (PRINZIDE;ZESTORETIC) 10-12.5 MG per tablet; Take 1 tablet by mouth daily, Disp-90 tablet, R-1Normal  -     amLODIPine (NORVASC) 10 MG tablet; Take 1 tablet by mouth daily, Disp-90 tablet, R-1Normal  2. Type 2 diabetes mellitus with hyperglycemia, with long-term current use of insulin (HCC)  Comments:  not well controlled.  she will find pharmacy with mounjaro in stock and let us know.  Orders:  -     LANTUS SOLOSTAR 100 UNIT/ML injection pen; Inject 44 Units into the skin nightly, Disp-45 mL, R-1, DAWNormal  -     metFORMIN (GLUCOPHAGE-XR) 500 MG extended release tablet; Take 2 tablets by mouth 2 times daily, Disp-360 tablet, R-1Normal  -     Hemoglobin A1C  -     Comprehensive Metabolic Panel  -     Lipid Panel  -     CBC  3. Generalized anxiety disorder  Comments:  stable on celexa  Orders:  -     citalopram (CELEXA) 40 MG tablet; Take 1 tablet by mouth daily, Disp-90 tablet, R-1Normal  4. Mixed hyperlipidemia  Comments:  due for fasting labs, taking crestor  Orders:  -     rosuvastatin (CRESTOR) 40 MG tablet; Take 1 tablet by mouth nightly, Disp-90 tablet, R-1Normal  5. Vitamin D deficiency  -     Vitamin D 25 Hydroxy  6. Neck pain  Comments:  discuss ROM stretches, massage and tizanidine at night.  Orders:  -     tiZANidine (ZANAFLEX) 4 MG tablet; Take 1 tablet by mouth nightly as needed (neck pain), Disp-10 tablet, R-0Normal      Return in about 3 months (around 08/24/2023) for Diabetes.    Subjective:   [name] was seen today for Follow-up (DM)     Diabetes: Last A1c was   Hemoglobin A1C   Date Value Ref Range  Status   01/10/2023 11.8 (H) 4.8 - 5.6 % Final     Comment:                 Prediabetes: 5.7 - 6.4           Diabetes: >6.4           Glycemic control for adults with diabetes: <7.0       Patient using lantus, metformin.  Was on mounajro with improvement in her glucose however she has not been able to get it at [facility] due to availability issues.  Denies neuropathy.    Hypertension: Patients hypertension is well controlled on regimen of lisinopril and amlodipine Denies headaches, blurred vision or dizziness.       Hyperlipidemia: Mixed hyperlipidemia well controlled on crestor.   Denies any leg cramps or malaise from this medication.  Reported compliance with taking medication daily.      Depression/Anxiety:  Patient notes that their mood is well controlled on celexa.   Is seeing a therapist, Dr. Shawnie Pons    Neck pain: Patient had xray at the ER that showed forminal crowding at the C4-5 level secondary to facet and unconvertable spurring.  Review of Systems   Constitutional:  Positive for fatigue.   Eyes:  Negative for visual disturbance.   Respiratory:  Negative for chest tightness and shortness of breath.    Cardiovascular:  Negative for chest pain, palpitations and leg swelling.   Allergic/Immunologic: Negative for environmental allergies.   Neurological:  Negative for dizziness, weakness, light-headedness and headaches.   Psychiatric/Behavioral:  Negative for sleep disturbance. The patient is not nervous/anxious.           Objective:     Vitals:    05/24/23 0942   BP: (!) 151/93   Site: Left Upper Arm   Position: Sitting   Cuff Size: Large Adult   Pulse: 100   Resp: 16   Temp: 98.4 F (36.9 C)   TempSrc: Oral   SpO2: 98%   Weight: 132 kg (291 lb)   Height: 1.727 m (5\' 8" )      Body mass index is 44.25 kg/m.     Physical Exam  Constitutional:       Appearance: Normal appearance. She is obese.   HENT:      Head: Normocephalic.   Eyes:      Conjunctiva/sclera: Conjunctivae normal.   Cardiovascular:      Rate and  Rhythm: Normal rate and regular rhythm.      Pulses: Normal pulses.      Heart sounds: Normal heart sounds.   Pulmonary:      Effort: Pulmonary effort is normal.      Breath sounds: Normal breath sounds.   Musculoskeletal:      Cervical back: Normal range of motion.   Skin:     General: Skin is warm and dry.   Neurological:      Mental Status: She is alert and oriented to person, place, and time.   Psychiatric:         Mood and Affect: Mood normal.         Behavior: Behavior normal.          Allergies   Allergen Reactions    Codeine Other (See Comments)     Dizziness/delirium    Oxycodone-Acetaminophen Other (See Comments)     dizziness       Current Outpatient Medications   Medication Sig Dispense Refill    lisinopril-hydroCHLOROthiazide (PRINZIDE;ZESTORETIC) 10-12.5 MG per tablet Take 1 tablet by mouth daily 90 tablet 1    LANTUS SOLOSTAR 100 UNIT/ML injection pen Inject 44 Units into the skin nightly 45 mL 1    citalopram (CELEXA) 40 MG tablet Take 1 tablet by mouth daily 90 tablet 1    amLODIPine (NORVASC) 10 MG tablet Take 1 tablet by mouth daily 90 tablet 1    metFORMIN (GLUCOPHAGE-XR) 500 MG extended release tablet Take 2 tablets by mouth 2 times daily 360 tablet 1    rosuvastatin (CRESTOR) 40 MG tablet Take 1 tablet by mouth nightly 90 tablet 1    tiZANidine (ZANAFLEX) 4 MG tablet Take 1 tablet by mouth nightly as needed (neck pain) 10 tablet 0    vitamin D (ERGOCALCIFEROL) 1.25 MG (50000 UT) CAPS capsule Take 1 capsule by mouth once a week 12 capsule 0     No current facility-administered medications for this visit.       Reviewed and updated this visit:  Tobacco  Allergies  Meds  Problems  Med Hx  Surg Hx  Soc Hx  Fam Hx     Court Joy, APRN - NP

## 2023-06-17 ENCOUNTER — Encounter
Payer: PRIVATE HEALTH INSURANCE | Attending: Student in an Organized Health Care Education/Training Program | Primary: Family

## 2023-06-22 ENCOUNTER — Telehealth

## 2023-06-22 NOTE — Telephone Encounter (Signed)
Pt said [facility] located  [address], [place], [place] [address] please send Greggory Keen here

## 2023-06-23 MED ORDER — TIRZEPATIDE 5 MG/0.5ML SC SOPN
50.5 MG/0.ML | SUBCUTANEOUS | 0 refills | Status: AC
Start: 2023-06-23 — End: 2023-09-22

## 2023-06-28 NOTE — Telephone Encounter (Signed)
Pt called stating that PA has been sent to wrong Insurance and that she is going to have new Insurance information placed into chart.

## 2023-07-01 NOTE — Telephone Encounter (Signed)
Called [employer] [phone] in ref to [facility]  Information sent waiting decision, pt has been informed waiting.

## 2023-07-04 NOTE — Progress Notes (Signed)
Patient's HM shows they are overdue for Colonoscopy, A1C,   CareEverywhere and [employer] files searched without success.

## 2023-08-12 ENCOUNTER — Encounter
Payer: BLUE CROSS/BLUE SHIELD | Attending: Student in an Organized Health Care Education/Training Program | Primary: Family

## 2023-08-26 ENCOUNTER — Ambulatory Visit
Admit: 2023-08-26 | Discharge: 2023-08-26 | Payer: BLUE CROSS/BLUE SHIELD | Attending: Student in an Organized Health Care Education/Training Program | Primary: Family

## 2023-08-26 ENCOUNTER — Encounter

## 2023-08-26 DIAGNOSIS — E042 Nontoxic multinodular goiter: Secondary | ICD-10-CM

## 2023-08-26 NOTE — Progress Notes (Signed)
 Have you been to the ER, urgent care clinic since your last visit?  Hospitalized since your last visit?    YES - When: approximately 3 months ago.  Where and Why: [place] office.    "Have you seen or consulted any other health care providers outside our system since your last visit?"    YES - When: approximately 3 months ago.  Where and Why: office.     "Have you had a pap smear?"    YES - Where: Dr. Milford     Date of last Cervical Cancer screen (HPV or PAP): 06/02/2018       "Have you had a colorectal cancer screening such as a colonoscopy/FIT/Cologuard?    NO    No colonoscopy on file  No cologuard on file  No FIT/FOBT on file   No flexible sigmoidoscopy on file     "Have you had a diabetic eye exam?"    NO     No diabetic eye exam on file     Chief Complaint   Patient presents with    New Patient    Thyroid Problem     Has issues with swallowing       BP 133/86 (Site: Right Upper Arm, Position: Sitting, Cuff Size: Large Adult)   Pulse 65   Temp 97.9 F (36.6 C) (Temporal)   Resp 16   Ht 1.727 m (5' 8)   Wt 129.4 kg (285 lb 3.2 oz)   LMP 08/07/2023 (Exact Date)   BMI 43.36 kg/m     Click Here for Release of Records Request

## 2023-08-26 NOTE — Progress Notes (Signed)
 [facility], [place]                                      Patient Information Name : Sandy King 47 y.o.   D.O.B : February 19, 1976         Referred by: Milford Gerri CROME, APRN - NP         Chief Complaint   Patient presents with    New Patient    Thyroid Problem     Has issues with swallowing       History of Present Illness: Sandy King is a 47 y.o. female here for initial visit of  multinodular goiter     She notes that she has mild difficulty swallowing solid  food for the last 2-3 months , no hx difficulty breathing in the supine position. There is no difficulty swallowing water or any liquids. She does not have any pain in the neck or uncomfortable sensation.     There is no family hx hypothyroidism or multinodular goiter.     She does c/o neck swelling and that she can feel the left side of the neck swelling . The patient doe snot have hx hypothyroidism or hyperthyroidism.   There is no hx of palpitations, diarrhea, constipation, heat intolerance, cold intolerance, weight changes, tremors, depression, anxiety, fatigue.  The patient notes that her appetite has recently decreased because she was started on GLP-1 receptor agonist for her diabetes by her PCP.    There is no history of thyroid cancer in the family, no personal history of head and neck radiation.  Wt Readings from Last 3 Encounters:   08/26/23 129.4 kg (285 lb 3.2 oz)   05/24/23 132 kg (291 lb)   01/10/23 130.2 kg (287 lb)       BP Readings from Last 3 Encounters:   08/26/23 133/86   05/24/23 (!) 151/93   01/10/23 (!) 164/96           Past Medical History:   Diagnosis Date    Acne vulgaris     Anxiety     Asthma     Chlamydia     Chondromalacia patellae of right knee     Depression     Diabetes (HCC)     Headache(784.0)     Hypertension     Keloid of skin     Migraines        Past Surgical History:   Procedure Laterality Date    CESAREAN SECTION         Family History   Problem Relation Age of Onset     Cancer Mother         breast    Hypertension Mother     Diabetes Mother     Breast Cancer Mother         Current Outpatient Medications   Medication Sig    Tirzepatide  (MOUNJARO ) 5 MG/0.5ML SOPN SC injection Inject 0.5 mLs into the skin once a week    lisinopril -hydroCHLOROthiazide  (PRINZIDE ;ZESTORETIC ) 10-12.5 MG per tablet Take 1 tablet by mouth daily    LANTUS  SOLOSTAR 100 UNIT/ML injection pen Inject 44 Units into the skin nightly    citalopram  (CELEXA ) 40 MG tablet Take 1 tablet by mouth daily    amLODIPine  (NORVASC ) 10 MG tablet Take 1 tablet by mouth daily    metFORMIN  (GLUCOPHAGE -XR) 500 MG extended release tablet  Take 2 tablets by mouth 2 times daily    rosuvastatin  (CRESTOR ) 40 MG tablet Take 1 tablet by mouth nightly    tiZANidine  (ZANAFLEX ) 4 MG tablet Take 1 tablet by mouth nightly as needed (neck pain) (Patient not taking: Reported on 08/26/2023)    vitamin D  (ERGOCALCIFEROL ) 1.25 MG (50000 UT) CAPS capsule Take 1 capsule by mouth once a week     No current facility-administered medications for this visit.       Social History     Socioeconomic History    Marital status: Single     Spouse name: Not on file    Number of children: Not on file    Years of education: Not on file    Highest education level: Not on file   Occupational History    Not on file   Tobacco Use    Smoking status: Never    Smokeless tobacco: Never   Substance and Sexual Activity    Alcohol use: Yes     Comment: not an every week/day occurrence    Drug use: No    Sexual activity: Yes     Partners: Male     Birth control/protection: None   Other Topics Concern    Not on file   Social History Narrative    Not on file     Social Determinants of Health     Financial Resource Strain: Low Risk  (10/27/2021)    Overall Financial Resource Strain (CARDIA)     Difficulty of Paying Living Expenses: Not very hard   Food Insecurity: Not on file (01/07/2023)   Transportation Needs: Not on file   Physical Activity: Not on file   Stress: Not on file    Social Connections: Not on file   Intimate Partner Violence: Not on file   Housing Stability: Not on file         Allergies   Allergen Reactions    Codeine Other (See Comments)     Dizziness/delirium    Oxycodone-Acetaminophen Other (See Comments)     dizziness         Review of Systems:   Review of Systems   Constitutional: Negative.  Negative for activity change, appetite change and fatigue.   HENT:  Positive for trouble swallowing. Negative for sore throat and voice change.    Eyes: Negative.  Negative for visual disturbance.   Respiratory: Negative.  Negative for cough and shortness of breath.    Cardiovascular: Negative.  Negative for chest pain, palpitations and leg swelling.   Gastrointestinal: Negative.  Negative for abdominal distention, abdominal pain, constipation, diarrhea, nausea and vomiting.   Endocrine: Negative.  Negative for cold intolerance, heat intolerance, polydipsia and polyuria.   Genitourinary: Negative.  Negative for difficulty urinating.   Musculoskeletal: Negative.  Negative for myalgias.   Neurological: Negative.  Negative for dizziness and weakness.   Psychiatric/Behavioral: Negative.  Negative for agitation, confusion and sleep disturbance. The patient is not nervous/anxious.         Physical Examination:  Physical Exam  Constitutional:       Appearance: Normal appearance. She is obese.   HENT:      Head: Normocephalic and atraumatic.      Nose: Nose normal.      Mouth/Throat:      Mouth: Mucous membranes are moist.      Pharynx: Oropharynx is clear.   Eyes:      Extraocular Movements: Extraocular movements intact.      Conjunctiva/sclera:  Conjunctivae normal.      Pupils: Pupils are equal, round, and reactive to light.   Neck:      Comments: Asymmetrical enlargement of the thyroid gland ( R>L), no tenderness.   Cardiovascular:      Rate and Rhythm: Normal rate and regular rhythm.      Pulses: Normal pulses.      Heart sounds: Normal heart sounds.   Pulmonary:      Effort: Pulmonary  effort is normal.      Breath sounds: Normal breath sounds.   Abdominal:      General: Abdomen is flat. Bowel sounds are normal.      Palpations: Abdomen is soft.   Musculoskeletal:         General: Normal range of motion.      Cervical back: Normal range of motion and neck supple.      Comments: No tremors in the outstretched extremities   Skin:     General: Skin is warm.   Neurological:      General: No focal deficit present.      Mental Status: She is alert.   Psychiatric:         Mood and Affect: Mood normal.         Behavior: Behavior normal.              Data Reviewed:        []  Reviewed labs    Lab Results   Component Value Date/Time    LDL 79 01/10/2023 12:00 AM          Assessment/Plan:     1. Multinodular goiter     US  thyroid :     INDICATION:  Enlarged thyroid     COMPARISON:  None     TECHNIQUE:  Real time and color Doppler ultrasound of the thyroid gland.     FINDINGS:  The right thyroid lobe measures 7.6 x 4.6 x 5.4 cm. The left thyroid  lobe measures 5.5 x 2.1 x 2.1 cm. The isthmus measures 1.2 cm in AP thickness.     The thyroid gland is diffusely enlarged and heterogeneous with normal  vascularity.     Right thyroid nodule: Heterogeneous solid nodule encompassing the majority of  the right thyroid lobe measuring 5.4 x 3.5 x 5.2 cm.     Left thyroid nodule: Hypoechoic solid nodule measuring 0.9 x 0.5 x 0.8 cm.  Hypoechoic solid nodule measuring 1.2 x 1.1 x 1.3 cm.     There are no enlarged lymph nodes in the right or left neck.     IMPRESSION:  Multinodular goiter with a dominant solid nodule encompassing the majority of  the right thyroid lobe measuring 5.4 cm for which ultrasound-guided biopsy is  recommended.    Plan-   Will get free T4, TSH and thyroid ab to evaluate thyroid function   If euthyroid biochemically will proceed with FNAB of the right thyroid lobe nodule and left thyroid lobe nodule ( hypoechoic 1.3 cm; minimum TIRADS 4 based on the features , could be even more) to exclude  malignancy.     I have discussed the diagnosis with the patient and the intended plan as seen in the above orders.  The patient has received an after-visit summary and questions were answered concerning future plans.  I have discussed medication side effects .      Thank you for allowing me to participate in the care of this patient.    Garfield Hals,  MD       Patient verbalized understanding     Voice-recognition software was used to generate this report, which may result in some phonetic-based errors in the grammar and contents.  Even though attempts were made to correct all the mistakes, some may have been missed and remained in the body of the report.

## 2023-08-27 LAB — THYROID ANTIBODIES
Thyroglobulin Ab: <1 [IU]/mL (ref 0.0–0.9)
Thyroid Peroxidase (TPO) Abs: 19 [IU]/mL (ref 0–34)

## 2023-08-27 LAB — TSH + FREE T4 PANEL
T4 Free: 0.97 ng/dL (ref 0.82–1.77)
TSH: 1.51 u[IU]/mL (ref 0.450–4.500)

## 2023-08-29 ENCOUNTER — Encounter

## 2023-08-29 NOTE — Other (Signed)
Hi,     I have informed the patient about the results and biopsy order. Can you please mail her the order along with the scheduling form?     Thanks.

## 2023-08-29 NOTE — Progress Notes (Signed)
 Multinodular goiter:     Patient is clinically and biochemically euthyroid ; will order FNAB of the right thyroid nodule and mail the order to the patient.   Will repeat US  thyroid and neck in 1 year to monitor for the left thyroid nodule ; will hold off on the biopsy of the left thyroid 1.3 cm nodule as the cut off for biopsy of TIRADS 4 nodule is 1.5 cm.     I spoke with the patient and have informed her about the plan going forward.

## 2023-08-30 NOTE — Telephone Encounter (Addendum)
 Printed and mailed to pt yesterday.----- Message from Dr. Garfield Hals, MD sent at 08/29/2023 12:57 PM EDT -----  Hi,     I have informed the patient about the results and biopsy order. Can you please mail her the order along with the scheduling form?     Thanks.

## 2023-09-19 ENCOUNTER — Other Ambulatory Visit: Payer: Self-pay

## 2023-09-19 ENCOUNTER — Encounter (HOSPITAL_BASED_OUTPATIENT_CLINIC_OR_DEPARTMENT_OTHER): Payer: Self-pay

## 2023-09-19 ENCOUNTER — Emergency Department (HOSPITAL_BASED_OUTPATIENT_CLINIC_OR_DEPARTMENT_OTHER)
Admission: EM | Admit: 2023-09-19 | Discharge: 2023-09-19 | Disposition: A | Discharge status: Home / Self Care | Payer: BC Managed Care – PPO | Attending: Emergency Medicine | Admitting: Emergency Medicine

## 2023-09-19 DIAGNOSIS — B9689 Other specified bacterial agents as the cause of diseases classified elsewhere: Secondary | ICD-10-CM | POA: Diagnosis not present

## 2023-09-19 DIAGNOSIS — Z202 Contact with and (suspected) exposure to infections with a predominantly sexual mode of transmission: Secondary | ICD-10-CM | POA: Insufficient documentation

## 2023-09-19 DIAGNOSIS — N760 Acute vaginitis: Secondary | ICD-10-CM | POA: Insufficient documentation

## 2023-09-19 LAB — URINALYSIS, ROUTINE W REFLEX MICROSCOPIC
Bilirubin Urine: NEGATIVE
Glucose, UA: NEGATIVE mg/dL
Hgb urine dipstick: NEGATIVE
Ketones, ur: NEGATIVE mg/dL
Nitrite: NEGATIVE
Protein, ur: NEGATIVE mg/dL
Specific Gravity, Urine: >=1.03 (ref 1.005–1.030)
pH: 5.5 (ref 5.0–8.0)

## 2023-09-19 LAB — WET PREP, GENITAL
Sperm: NONE SEEN
Trich, Wet Prep: NONE SEEN
WBC, Wet Prep HPF POC: <10 (ref <10)

## 2023-09-19 LAB — URINALYSIS, MICROSCOPIC (REFLEX): RBC / HPF: NONE SEEN RBC/hpf (ref 0–5)

## 2023-09-19 LAB — PREGNANCY, URINE: Preg Test, Ur: NEGATIVE

## 2023-09-19 MED ORDER — METRONIDAZOLE 500 MG PO TABS: 500.0000 mg | ORAL_TABLET | Freq: Two times a day (BID) | ORAL | 0 refills | Status: AC

## 2023-09-19 MED ORDER — FLUCONAZOLE 150 MG PO TABS
150.0000 mg | ORAL_TABLET | Freq: Once | ORAL | Status: AC
  Administered 2023-09-19: 150 mg via ORAL
  Filled 2023-09-19: qty 1

## 2023-09-19 MED ORDER — ONDANSETRON 4 MG PO TBDP
4.0000 mg | ORAL_TABLET | Freq: Once | ORAL | Status: AC
  Administered 2023-09-19: 4 mg via ORAL
  Filled 2023-09-19: qty 1

## 2023-09-19 MED ORDER — AZITHROMYCIN 250 MG PO TABS
1000.0000 mg | ORAL_TABLET | Freq: Once | ORAL | Status: AC
  Administered 2023-09-19: 1000 mg via ORAL
  Filled 2023-09-19: qty 4

## 2023-09-19 MED ORDER — CEFTRIAXONE SODIUM 1 G IJ SOLR
1.0000 g | Freq: Once | INTRAMUSCULAR | Status: AC
  Administered 2023-09-19: 1 g via INTRAMUSCULAR
  Filled 2023-09-19: qty 10

## 2023-09-19 MED ORDER — METRONIDAZOLE 500 MG PO TABS
500.0000 mg | ORAL_TABLET | Freq: Once | ORAL | Status: AC
Start: 2023-09-19 — End: 2023-09-19
  Administered 2023-09-19: 500 mg via ORAL
  Filled 2023-09-19: qty 1

## 2023-09-19 MED ORDER — LIDOCAINE HCL (PF) 1 % IJ SOLN
INTRAMUSCULAR | Status: AC
  Administered 2023-09-19: 1 mL
  Filled 2023-09-19: qty 5

## 2023-09-19 NOTE — ED Triage Notes (Signed)
Patient states she wants to be checked trichomoniasis. Patient states she was exposed and received a call from her partner that he tested positive for it.

## 2023-09-19 NOTE — Discharge Instructions (Signed)
Your swab was negative for trichomoniasis.  We have elected to treat you for this as well as gonorrhea and chlamydia presumptively.  Please follow-up with your family doctor in the office.  The test for gonorrhea chlamydia and syphilis and HIV will not come back while you are here.  Please check this on MyChart if he can.  There is a process in place to call you on the phone but it is not perfect.  If you are positive you should let your partners know to be tested and treated up to 6 months before today.

## 2023-09-19 NOTE — ED Provider Notes (Signed)
[place] [facility] AT [facility] [place] Provider Note   CSN: [id number] Arrival date & time: 09/19/23  1949     History  Chief Complaint  Patient presents with   SEXUALLY TRANSMITTED DISEASE    Cynthia Byrd is a 48 y.o. female.  47 yo F with a chief complaints of having a sexual partner tell her that they tested positive for trichomoniasis.  She actually said that her partner said that someone that he had been with told him that they had had it.  He then let her know so that she could be tested and treated.  She denies any symptoms denies vaginal discharge denies abdominal or pelvic pain denies rash.        Home Medications Prior to Admission medications   Medication Sig Start Date End Date Taking? Authorizing Provider  metroNIDAZOLE (FLAGYL) 500 MG tablet Take 1 tablet (500 mg total) by mouth 2 (two) times daily. 09/19/23  Yes Melene Plan, DO  amLODipine (NORVASC) 5 MG tablet Take 5 mg by mouth daily.    [provider]  aspirin-acetaminophen-caffeine (EXCEDRIN MIGRAINE) [phone] MG tablet Take by mouth every 6 (six) hours as needed for headache.    [provider]  citalopram (CELEXA) 10 MG tablet Take 10 mg daily by mouth.    [provider]  LANTUS SOLOSTAR 100 UNIT/ML Solostar Pen Inject into the skin. 12/18/21   [provider]  lidocaine (XYLOCAINE) 5 % ointment Apply 1 application topically as needed. 01/10/22   Achille Rich, PA-C  metFORMIN (GLUCOPHAGE) 1000 MG tablet Take 1,000 mg by mouth 2 (two) times daily with a meal.    [provider]  methocarbamol (ROBAXIN) 500 MG tablet Take 1 tablet (500 mg total) by mouth 2 (two) times daily. 09/23/21   Melene Plan, DO  ondansetron (ZOFRAN-ODT) 8 MG disintegrating tablet Take 1 tablet (8 mg total) by mouth every 8 (eight) hours as needed for nausea or vomiting. 05/17/23   Trevor Iha, FNP  phenazopyridine (PYRIDIUM) 200 MG tablet Take 1 tablet (200 mg total) by  mouth 3 (three) times daily. 03/21/20   Pollyann Savoy, MD  Rizatriptan Benzoate (MAXALT PO) Take by mouth.    [provider]      Allergies    Codeine and Percocet [oxycodone-acetaminophen]    Review of Systems   Review of Systems  Physical Exam Updated Vital Signs BP (!) 167/102 (BP Location: Right Arm)   Pulse (!) 104   Temp 98.2 F (36.8 C)   Resp 18   Ht 5\' 8"  (1.727 m)   Wt 127 kg   LMP 08/26/2023   SpO2 98%   BMI 42.57 kg/m  Physical Exam Vitals and nursing note reviewed.  Constitutional:      General: She is not in acute distress.    Appearance: She is well-developed. She is not diaphoretic.  HENT:     Head: Normocephalic and atraumatic.  Eyes:     Pupils: Pupils are equal, round, and reactive to light.  Cardiovascular:     Rate and Rhythm: Normal rate and regular rhythm.     Heart sounds: No murmur heard.    No friction rub. No gallop.  Pulmonary:     Effort: Pulmonary effort is normal.     Breath sounds: No wheezing or rales.  Abdominal:     General: There is no distension.     Palpations: Abdomen is soft.     Tenderness: There is no abdominal tenderness.  Musculoskeletal:        General: No tenderness.     Cervical back: Normal range of motion and neck supple.  Skin:    General: Skin is warm and dry.  Neurological:     Mental Status: She is alert and oriented to person, place, and time.  Psychiatric:        Behavior: Behavior normal.     ED Results / Procedures / Treatments   Labs (all labs ordered are listed, but only abnormal results are displayed) Labs Reviewed  WET PREP, GENITAL - Abnormal; Notable for the following components:      Result Value   Yeast Wet Prep HPF POC PRESENT (*)    Clue Cells Wet Prep HPF POC PRESENT (*)    All other components within normal limits  URINALYSIS, ROUTINE W REFLEX MICROSCOPIC - Abnormal; Notable for the following components:   Leukocytes,Ua TRACE (*)    All other components within normal  limits  URINALYSIS, MICROSCOPIC (REFLEX) - Abnormal; Notable for the following components:   Bacteria, UA RARE (*)    All other components within normal limits  PREGNANCY, URINE  RPR  HIV ANTIBODY (ROUTINE TESTING W REFLEX)  GC/CHLAMYDIA PROBE AMP ([place]) NOT AT [facility]    EKG None  Radiology No results found.  Procedures Procedures    Medications Ordered in ED Medications  cefTRIAXone (ROCEPHIN) injection 1 g (has no administration in time range)  azithromycin (ZITHROMAX) tablet 1,000 mg (has no administration in time range)  metroNIDAZOLE (FLAGYL) tablet 500 mg (has no administration in time range)  fluconazole (DIFLUCAN) tablet 150 mg (has no administration in time range)  ondansetron (ZOFRAN-ODT) disintegrating tablet 4 mg (has no administration in time range)    ED Course/ Medical Decision Making/ A&P                                 Medical Decision Making Amount and/or Complexity of Data Reviewed Labs: ordered.  Risk Prescription drug management.   47 yo F with a chief complaints of possible exposure to trichomoniasis.  She has no symptoms.  Wet prep is negative for trichomonas.  Shared decision making at bedside.  Electing to be tested and treated for the other common STDs.  PCP follow-up.  9:40 PM:  I have discussed the diagnosis/risks/treatment options with the patient.  Evaluation and diagnostic testing in the emergency department does not suggest an emergent condition requiring admission or immediate intervention beyond what has been performed at this time.  They will follow up with PCP. We also discussed returning to the ED immediately if new or worsening sx occur. We discussed the sx which are most concerning (e.g., sudden worsening pain, fever, inability to tolerate by mouth) that necessitate immediate return. Medications administered to the patient during their visit and any new prescriptions provided to the patient are listed below.  Medications given  during this visit Medications  cefTRIAXone (ROCEPHIN) injection 1 g (has no administration in time range)  azithromycin (ZITHROMAX) tablet 1,000 mg (has no administration in time range)  metroNIDAZOLE (FLAGYL) tablet 500 mg (has no administration in time range)  fluconazole (DIFLUCAN) tablet 150 mg (has no administration in time range)  ondansetron (ZOFRAN-ODT) disintegrating tablet 4 mg (has no administration in time range)     The patient appears reasonably screen and/or stabilized for discharge and I doubt any other medical condition or other [facility] requiring further screening, evaluation, or treatment  in the ED at this time prior to discharge.          Final Clinical Impression(s) / ED Diagnoses Final diagnoses:  Potential exposure to STD  BV (bacterial vaginosis)    Rx / DC Orders ED Discharge Orders          Ordered    metroNIDAZOLE (FLAGYL) 500 MG tablet  2 times daily        09/19/23 2138              Melene Plan, DO 09/19/23 2140

## 2023-09-20 LAB — HIV ANTIBODY (ROUTINE TESTING W REFLEX): HIV Screen 4th Generation wRfx: NONREACTIVE

## 2023-09-20 LAB — GC/CHLAMYDIA PROBE AMP (~~LOC~~) NOT AT ARMC
Chlamydia: NEGATIVE
Comment: NEGATIVE
Comment: NORMAL
Neisseria Gonorrhea: NEGATIVE

## 2023-09-20 LAB — RPR: RPR Ser Ql: NONREACTIVE

## 2023-09-22 ENCOUNTER — Encounter

## 2023-09-22 MED ORDER — MOUNJARO 5 MG/0.5ML SC SOPN
50.5 MG/0.ML | SUBCUTANEOUS | 0 refills | Status: AC
Start: 2023-09-22 — End: 2023-10-25

## 2023-09-23 ENCOUNTER — Inpatient Hospital Stay
Payer: BLUE CROSS/BLUE SHIELD | Attending: Student in an Organized Health Care Education/Training Program | Primary: Family

## 2023-09-23 ENCOUNTER — Encounter
Payer: BLUE CROSS/BLUE SHIELD | Attending: Student in an Organized Health Care Education/Training Program | Primary: Family

## 2023-10-13 ENCOUNTER — Encounter

## 2023-10-21 ENCOUNTER — Inpatient Hospital Stay
Admit: 2023-10-21 | Disposition: A | Discharge status: Home / Self Care | Payer: BLUE CROSS/BLUE SHIELD | Attending: Student in an Organized Health Care Education/Training Program | Admitting: Student in an Organized Health Care Education/Training Program | Primary: Family

## 2023-10-21 DIAGNOSIS — E042 Nontoxic multinodular goiter: Principal | ICD-10-CM

## 2023-10-24 ENCOUNTER — Encounter: Admit: 2023-10-24 | Admitting: Family

## 2023-10-24 DIAGNOSIS — E1165 Type 2 diabetes mellitus with hyperglycemia: Secondary | ICD-10-CM

## 2023-10-24 DIAGNOSIS — Z794 Long term (current) use of insulin: Secondary | ICD-10-CM

## 2023-10-25 MED ORDER — MOUNJARO 5 MG/0.5ML SC SOAJ
5 MG/0.ML | SUBCUTANEOUS | 0 refills | Status: DC
Start: 2023-10-25 — End: 2024-01-04

## 2023-12-09 ENCOUNTER — Other Ambulatory Visit: Payer: Self-pay

## 2023-12-09 ENCOUNTER — Emergency Department (HOSPITAL_BASED_OUTPATIENT_CLINIC_OR_DEPARTMENT_OTHER): Payer: BC Managed Care – PPO

## 2023-12-09 ENCOUNTER — Encounter (HOSPITAL_BASED_OUTPATIENT_CLINIC_OR_DEPARTMENT_OTHER): Payer: Self-pay

## 2023-12-09 ENCOUNTER — Emergency Department (HOSPITAL_BASED_OUTPATIENT_CLINIC_OR_DEPARTMENT_OTHER)
Admission: EM | Admit: 2023-12-09 | Discharge: 2023-12-09 | Disposition: A | Discharge status: Home / Self Care | Payer: BC Managed Care – PPO | Attending: Emergency Medicine | Admitting: Emergency Medicine

## 2023-12-09 DIAGNOSIS — Z7984 Long term (current) use of oral hypoglycemic drugs: Secondary | ICD-10-CM | POA: Diagnosis not present

## 2023-12-09 DIAGNOSIS — E119 Type 2 diabetes mellitus without complications: Secondary | ICD-10-CM | POA: Diagnosis not present

## 2023-12-09 DIAGNOSIS — J069 Acute upper respiratory infection, unspecified: Secondary | ICD-10-CM | POA: Insufficient documentation

## 2023-12-09 DIAGNOSIS — I10 Essential (primary) hypertension: Secondary | ICD-10-CM | POA: Insufficient documentation

## 2023-12-09 DIAGNOSIS — Z794 Long term (current) use of insulin: Secondary | ICD-10-CM | POA: Insufficient documentation

## 2023-12-09 DIAGNOSIS — Z20822 Contact with and (suspected) exposure to covid-19: Secondary | ICD-10-CM | POA: Diagnosis not present

## 2023-12-09 DIAGNOSIS — Z79899 Other long term (current) drug therapy: Secondary | ICD-10-CM | POA: Insufficient documentation

## 2023-12-09 DIAGNOSIS — R059 Cough, unspecified: Secondary | ICD-10-CM | POA: Diagnosis present

## 2023-12-09 LAB — RESP PANEL BY RT-PCR (RSV, FLU A&B, COVID)  RVPGX2
Influenza A by PCR: NEGATIVE
Influenza B by PCR: NEGATIVE
Resp Syncytial Virus by PCR: NEGATIVE
SARS Coronavirus 2 by RT PCR: NEGATIVE

## 2023-12-09 MED ORDER — BENZONATATE 100 MG PO CAPS
100.0000 mg | ORAL_CAPSULE | Freq: Three times a day (TID) | ORAL | 0 refills | Status: AC
Start: 2023-12-09 — End: ?

## 2023-12-09 NOTE — ED Notes (Signed)
 Seen before triage.  12/09/23 1044  Respiratory Assessment  $ RT Protocol Assessment  Yes  Assessment Type Assess only  Respiratory Pattern Regular;Unlabored;Symmetrical;Dyspnea with exertion  Chest Assessment Chest expansion symmetrical  Cough Congested;Productive  Sputum Color Yellow (per patient)  Sputum Specimen Source Spontaneous cough  R Upper  Breath Sounds Diminished  L Upper Breath Sounds Diminished  R Lower Breath Sounds Fine crackles  L Lower Breath Sounds Fine crackles  Oxygen Therapy/Pulse Ox  O2 Therapy Room air  SpO2 100 %    Hx bronchitis, congested cough BBS decreased with fine crackles.  SpO2 100% on room air. Ambulated from lobby to triage room SpO2 remained 100%.

## 2023-12-09 NOTE — ED Triage Notes (Signed)
 The patient has had a cough, congestion and shortness of breath for a week.

## 2023-12-09 NOTE — Discharge Instructions (Addendum)
 You are seen in the emergency department today with concerns of a cough and shortness of breath.  You are negative for COVID-19, influenza, RSV as well as pneumonia or any other acute process on your chest x-ray.  I suspect likely having viral upper respiratory infection causing her symptoms.  You should continue manage her symptoms with over-the-counter medications as tolerated.  I have sent a prescription for Tessalon  for cough as needed.  Please try to take your daily medications as indicated to reduce the risk of any complications from these.  If you have any worsening symptoms or new symptoms develop, please return the emergency department.

## 2023-12-09 NOTE — ED Provider Notes (Signed)
 [facility] Provider Note   CSN: [id number] Arrival date & time: 12/09/23  1026     History Chief Complaint  Patient presents with   Cough   Shortness of Breath    Cynthia Byrd is a 48 y.o. female.  Patient past history significant for hypertension, type 2 diabetes, prior tubal ligation presents the emergency department concerns of a cough and shortness of breath.  States she has been having a cough with productive sputum for the last 4 days or so.  Reports multiple sick contacts over the holiday stretch recently.  Denies any chest pain or difficulty breathing although she does report some congestion typically worse at night.  No measured fever or chills but does endorse body aches.   Cough Associated symptoms: shortness of breath   Shortness of Breath Associated symptoms: cough        Home Medications Prior to Admission medications   Medication Sig Start Date End Date Taking? Authorizing Provider  benzonatate  (TESSALON ) 100 MG capsule Take 1 capsule (100 mg total) by mouth every 8 (eight) hours. 12/09/23  Yes Sloka Volante A, PA-C  amLODipine (NORVASC) 5 MG tablet Take 5 mg by mouth daily.    [provider]  aspirin-acetaminophen-caffeine (EXCEDRIN MIGRAINE) 250-250-65 MG tablet Take by mouth every 6 (six) hours as needed for headache.    [provider]  citalopram (CELEXA) 10 MG tablet Take 10 mg daily by mouth.    [provider]  LANTUS SOLOSTAR 100 UNIT/ML Solostar Pen Inject into the skin. 12/18/21   [provider]  lidocaine  (XYLOCAINE ) 5 % ointment Apply 1 application topically as needed. 01/10/22   Bernis Ernst, PA-C  metFORMIN (GLUCOPHAGE) 1000 MG tablet Take 1,000 mg by mouth 2 (two) times daily with a meal.    [provider]  methocarbamol  (ROBAXIN ) 500 MG tablet Take 1 tablet (500 mg total) by mouth 2 (two) times daily. 09/23/21   Emil Share, DO  metroNIDAZOLE  (FLAGYL ) 500 MG  tablet Take 1 tablet (500 mg total) by mouth 2 (two) times daily. 09/19/23   Emil Share, DO  ondansetron  (ZOFRAN -ODT) 8 MG disintegrating tablet Take 1 tablet (8 mg total) by mouth every 8 (eight) hours as needed for nausea or vomiting. 05/17/23   Teddy Sharper, FNP  phenazopyridine  (PYRIDIUM ) 200 MG tablet Take 1 tablet (200 mg total) by mouth 3 (three) times daily. 03/21/20   Roselyn Carlin NOVAK, MD  Rizatriptan Benzoate (MAXALT PO) Take by mouth.    [provider]      Allergies    Codeine and Percocet [oxycodone-acetaminophen]    Review of Systems   Review of Systems  Respiratory:  Positive for cough and shortness of breath.   All other systems reviewed and are negative.   Physical Exam Updated Vital Signs BP (!) 170/96 (BP Location: Right Arm)   Pulse 90   Temp 98.3 F (36.8 C) (Oral)   Resp 20   Ht 5' 8 (1.727 m)   Wt 127.5 kg   LMP 11/25/2023   SpO2 100%   BMI 42.73 kg/m  Physical Exam Vitals and nursing note reviewed.  Constitutional:      General: She is not in acute distress.    Appearance: She is well-developed.  HENT:     Head: Normocephalic and atraumatic.  Eyes:     Conjunctiva/sclera: Conjunctivae normal.  Cardiovascular:     Rate and Rhythm: Normal rate and regular rhythm.     Heart  sounds: No murmur heard. Pulmonary:     Effort: Pulmonary effort is normal. No tachypnea or respiratory distress.     Breath sounds: Normal breath sounds. No decreased breath sounds, wheezing, rhonchi or rales.  Musculoskeletal:        General: No swelling.     Cervical back: Neck supple.     Right lower leg: No edema.     Left lower leg: No edema.  Skin:    General: Skin is warm and dry.     Capillary Refill: Capillary refill takes less than 2 seconds.  Neurological:     Mental Status: She is alert.  Psychiatric:        Mood and Affect: Mood normal.     ED Results / Procedures / Treatments   Labs (all labs ordered are listed, but only abnormal results  are displayed) Labs Reviewed  RESP PANEL BY RT-PCR (RSV, FLU A&B, COVID)  [id number]    EKG None  Radiology DG Chest 2 View Result Date: 12/09/2023 CLINICAL DATA:  Cough and congestion EXAM: CHEST - 2 VIEW COMPARISON:  11/27/2019 FINDINGS: No consolidation, pneumothorax or effusion. No edema. Normal cardiopericardial silhouette. IMPRESSION: No acute cardiopulmonary disease. Electronically Signed   By: Ranell Bring M.D.   On: 12/09/2023 12:19    Procedures Procedures   Medications Ordered in ED Medications - No data to display  ED Course/ Medical Decision Making/ A&P                                 Medical Decision Making Amount and/or Complexity of Data Reviewed Radiology: ordered.   This patient presents to the ED for concern of cough, shortness of breath.  Differential diagnosis includes pharyngitis, viral URI, pneumonia, bronchitis,    Lab Tests:  I Ordered, and personally interpreted labs.  The pertinent results include: Respiratory panel negative for COVID-19, influenza and RSV   Imaging Studies ordered:  I ordered imaging studies including chest x-ray I independently visualized and interpreted imaging which showed no acute cardiopulmonary process present I agree with the radiologist interpretation   Problem List / ED Course:  Patient presents the emergency department concerns of a cough, congestion and shortness of breath.  States has been ongoing for the last 4 days.  Recently traveling in [place]  for the holidays.  Reports multiple sick contacts over this period of time.  She denies any fever or chills but does have notable body aches.  Also endorsing a productive cough with yellow sputum.  Denies any hemoptysis.  Has been trying over-the-counter medications with minimal improvement in symptoms. No unilateral leg swelling, mopped assist, recent surgery or trauma, prior PE or DVT, or hormone use.  Patient is PERC negative at this time.  Doubt PE. Given negative workup  with patient being negative for COVID-19, influenza, RSV, and chest x-ray being unremarkable with no signs of pneumonia or any other acute process ongoing, I suspect she likely does have a viral URI causing her symptoms.  I will send a prescription for Tessalon  to her pharmacy to try to address the cough.  Discussed return precautions such as worsening shortness of breath, chest pain, severe headaches, or concerns for dehydration.  Patient's vitals otherwise unremarkable at this time with no fever, normal heart rate at 98, BP slightly elevated at 170/96 although patient has history of hypertension has not been taking her medications, respiratory rate unremarkable at 20 with normal oxygen saturation at  100%.  Patient has home blood pressure medications available to her and will plan on taking these at discharge.  No indication for emergent intervention regarding blood pressure as patient is asymptomatic from a hypertensive emergency standpoint.  Given reassuring presentation, will plan on discharging patient home with outpatient PCP follow-up.   Final Clinical Impression(s) / ED Diagnoses Final diagnoses:  Viral URI with cough  Hypertension, unspecified type    Rx / DC Orders ED Discharge Orders          Ordered    benzonatate  (TESSALON ) 100 MG capsule  Every 8 hours        12/09/23 1513              Denys Salinger A, PA-C 12/09/23 1515    Elnor Bernarda SQUIBB, DO 12/12/23 [phone]

## 2023-12-10 ENCOUNTER — Encounter: Admit: 2023-12-10 | Admitting: Family

## 2023-12-10 DIAGNOSIS — M542 Cervicalgia: Secondary | ICD-10-CM

## 2023-12-11 MED ORDER — TIZANIDINE HCL 4 MG PO TABS
4 | ORAL_TABLET | Freq: Every evening | ORAL | 0 refills | Status: AC | PRN
Start: 2023-12-11 — End: ?

## 2024-01-03 ENCOUNTER — Encounter

## 2024-01-04 MED ORDER — CITALOPRAM HYDROBROMIDE 40 MG PO TABS
40 | ORAL_TABLET | Freq: Every day | ORAL | 0 refills | Status: AC
Start: 2024-01-04 — End: ?

## 2024-01-04 MED ORDER — MOUNJARO 5 MG/0.5ML SC SOAJ
5 | SUBCUTANEOUS | 0 refills | Status: AC
Start: 2024-01-04 — End: ?

## 2024-01-04 MED ORDER — LISINOPRIL-HYDROCHLOROTHIAZIDE 10-12.5 MG PO TABS
10-12.5 | ORAL_TABLET | Freq: Every day | ORAL | 0 refills | Status: AC
Start: 2024-01-04 — End: ?

## 2024-01-04 NOTE — Telephone Encounter (Signed)
Refills sent in on requested medication.  Patient is due for appointment before refills run out, please call to get them scheduled.

## 2024-01-13 NOTE — Telephone Encounter (Signed)
 Link sent

## 2024-02-06 ENCOUNTER — Encounter

## 2024-02-06 MED ORDER — MOUNJARO 5 MG/0.5ML SC SOAJ
5 MG/0.ML | SUBCUTANEOUS | 0 refills | Status: DC
Start: 2024-02-06 — End: 2024-04-16

## 2024-02-12 ENCOUNTER — Encounter

## 2024-02-12 MED ORDER — AMLODIPINE BESYLATE 10 MG PO TABS
10 | ORAL_TABLET | Freq: Every day | ORAL | 0 refills | Status: DC
Start: 2024-02-12 — End: 2024-07-17

## 2024-03-25 ENCOUNTER — Encounter

## 2024-03-26 MED ORDER — CITALOPRAM HYDROBROMIDE 40 MG PO TABS
40 | ORAL_TABLET | Freq: Every day | ORAL | 0 refills | 90.00000 days | Status: DC
Start: 2024-03-26 — End: 2024-07-17

## 2024-04-02 ENCOUNTER — Encounter

## 2024-04-02 MED ORDER — LANTUS SOLOSTAR 100 UNIT/ML SC SOPN
100 | SUBCUTANEOUS | 0 refills | 58.50000 days | Status: AC
Start: 2024-04-02 — End: ?

## 2024-04-16 ENCOUNTER — Encounter

## 2024-04-16 MED ORDER — MOUNJARO 5 MG/0.5ML SC SOAJ
5 | SUBCUTANEOUS | 0 refills | 28.00000 days | Status: DC
Start: 2024-04-16 — End: 2024-07-17

## 2024-05-29 ENCOUNTER — Encounter: Payer: BLUE CROSS/BLUE SHIELD | Attending: Family | Primary: Family

## 2024-06-18 ENCOUNTER — Encounter

## 2024-07-17 ENCOUNTER — Encounter

## 2024-07-17 MED ORDER — MOUNJARO 5 MG/0.5ML SC SOAJ
5 | SUBCUTANEOUS | 0 refills | 28.00000 days | Status: AC
Start: 2024-07-17 — End: ?

## 2024-07-17 MED ORDER — CITALOPRAM HYDROBROMIDE 40 MG PO TABS
40 | ORAL_TABLET | Freq: Every day | ORAL | 0 refills | 90.00000 days | Status: AC
Start: 2024-07-17 — End: ?

## 2024-07-17 MED ORDER — AMLODIPINE BESYLATE 10 MG PO TABS
10 | ORAL_TABLET | Freq: Every day | ORAL | 0 refills | 90.00000 days | Status: AC
Start: 2024-07-17 — End: ?

## 2024-08-15 ENCOUNTER — Encounter: Payer: BLUE CROSS/BLUE SHIELD | Attending: Family | Primary: Family

## 2024-09-22 ENCOUNTER — Encounter
